# Patient Record
Sex: Female | Born: 1975 | Hispanic: No | Marital: Married | Smoking: Never smoker
Health system: Southern US, Community
[De-identification: ages and names within clinical notes are randomized; demographics above are authoritative.]

## PROBLEM LIST (undated history)

## (undated) DIAGNOSIS — D649 Anemia, unspecified: Secondary | ICD-10-CM

## (undated) DIAGNOSIS — D693 Immune thrombocytopenic purpura: Secondary | ICD-10-CM

## (undated) DIAGNOSIS — O09529 Supervision of elderly multigravida, unspecified trimester: Secondary | ICD-10-CM

## (undated) HISTORY — PX: NO PAST SURGERIES: SHX2092

---

## 2006-08-15 DIAGNOSIS — D693 Immune thrombocytopenic purpura: Secondary | ICD-10-CM

## 2006-08-15 HISTORY — DX: Immune thrombocytopenic purpura: D69.3

## 2015-08-16 NOTE — L&D Delivery Note (Signed)
Delivery Note At 6:42 PM a viable female was delivered via Vaginal, Spontaneous Delivery (Presentation: ROA; Occiput Anterior).  APGAR: 8, 9; weight 7 lb 7.6 oz (3390 g).   Placenta status: Intact, Spontaneous.  Cord: 3 vessels with the following complications: none  Anesthesia: None  Lacerations: Periurethral Est. Blood Loss (mL): 100  Mom to postpartum.  Baby to Couplet care / Skin to Skin.  Brittany Potter 11/27/2015, 7:08 PM

## 2015-08-19 LAB — OB RESULTS CONSOLE VARICELLA ZOSTER ANTIBODY, IGG: Varicella: IMMUNE

## 2015-08-19 LAB — OB RESULTS CONSOLE RUBELLA ANTIBODY, IGM: Rubella: IMMUNE

## 2015-08-19 LAB — OB RESULTS CONSOLE HEPATITIS B SURFACE ANTIGEN: Hepatitis B Surface Ag: NEGATIVE

## 2015-08-19 LAB — OB RESULTS CONSOLE HIV ANTIBODY (ROUTINE TESTING): HIV: NONREACTIVE

## 2015-08-27 ENCOUNTER — Ambulatory Visit: Payer: Self-pay

## 2015-09-03 ENCOUNTER — Ambulatory Visit (HOSPITAL_COMMUNITY)
Admission: RE | Admit: 2015-09-03 | Discharge: 2015-09-03 | Disposition: A | Discharge status: Home / Self Care | Payer: Medicaid Other | Source: Ambulatory Visit

## 2015-09-03 ENCOUNTER — Ambulatory Visit
Admission: RE | Admit: 2015-09-03 | Discharge: 2015-09-03 | Disposition: A | Discharge status: Home / Self Care | Payer: Medicaid Other | Source: Ambulatory Visit | Attending: Maternal & Fetal Medicine | Admitting: Maternal & Fetal Medicine

## 2015-09-03 ENCOUNTER — Other Ambulatory Visit
Admission: RE | Admit: 2015-09-03 | Discharge: 2015-09-03 | Disposition: A | Discharge status: Home / Self Care | Payer: Medicaid Other | Source: Ambulatory Visit | Attending: Maternal & Fetal Medicine | Admitting: Maternal & Fetal Medicine

## 2015-09-03 VITALS — BP 114/59 | HR 82 | Temp 98.3°F | Resp 18 | Ht 66.0 in | Wt 184.2 lb

## 2015-09-03 DIAGNOSIS — D696 Thrombocytopenia, unspecified: Secondary | ICD-10-CM

## 2015-09-03 DIAGNOSIS — O99119 Other diseases of the blood and blood-forming organs and certain disorders involving the immune mechanism complicating pregnancy, unspecified trimester: Secondary | ICD-10-CM | POA: Diagnosis not present

## 2015-09-03 DIAGNOSIS — Z0379 Encounter for other suspected maternal and fetal conditions ruled out: Secondary | ICD-10-CM | POA: Insufficient documentation

## 2015-09-03 DIAGNOSIS — O09529 Supervision of elderly multigravida, unspecified trimester: Secondary | ICD-10-CM | POA: Insufficient documentation

## 2015-09-03 DIAGNOSIS — O09522 Supervision of elderly multigravida, second trimester: Secondary | ICD-10-CM

## 2015-09-03 DIAGNOSIS — Z3A26 26 weeks gestation of pregnancy: Secondary | ICD-10-CM | POA: Insufficient documentation

## 2015-09-03 LAB — COMPREHENSIVE METABOLIC PANEL
ALBUMIN: 3.2 g/dL — AB (ref 3.5–5.0)
ALT: 20 U/L (ref 14–54)
ANION GAP: 6 (ref 5–15)
AST: 20 U/L (ref 15–41)
Alkaline Phosphatase: 59 U/L (ref 38–126)
BUN: 9 mg/dL (ref 6–20)
CO2: 25 mmol/L (ref 22–32)
Calcium: 8.4 mg/dL — ABNORMAL LOW (ref 8.9–10.3)
Chloride: 106 mmol/L (ref 101–111)
Creatinine, Ser: 0.42 mg/dL — ABNORMAL LOW (ref 0.44–1.00)
GFR calc Af Amer: >60 mL/min (ref >60)
GFR calc non Af Amer: >60 mL/min (ref >60)
GLUCOSE: 80 mg/dL (ref 65–99)
POTASSIUM: 3.5 mmol/L (ref 3.5–5.1)
SODIUM: 137 mmol/L (ref 135–145)
TOTAL PROTEIN: 6.5 g/dL (ref 6.5–8.1)
Total Bilirubin: 0.5 mg/dL (ref 0.3–1.2)

## 2015-09-03 LAB — CBC WITH DIFFERENTIAL/PLATELET
Basophils Absolute: 0 10*3/uL (ref 0–0.1)
EOS ABS: 0 10*3/uL (ref 0–0.7)
Eosinophils Relative: 1 %
HCT: 34.2 % — ABNORMAL LOW (ref 35.0–47.0)
HEMOGLOBIN: 11.7 g/dL — AB (ref 12.0–16.0)
LYMPHS ABS: 1.3 10*3/uL (ref 1.0–3.6)
Lymphocytes Relative: 21 %
MCH: 31.7 pg (ref 26.0–34.0)
MCHC: 34.3 g/dL (ref 32.0–36.0)
MCV: 92.4 fL (ref 80.0–100.0)
Monocytes Absolute: 0.3 10*3/uL (ref 0.2–0.9)
Monocytes Relative: 5 %
Neutro Abs: 4.6 10*3/uL (ref 1.4–6.5)
Platelets: 51 10*3/uL — ABNORMAL LOW (ref 150–440)
RBC: 3.7 MIL/uL — AB (ref 3.80–5.20)
RDW: 13.8 % (ref 11.5–14.5)
WBC: 6.3 10*3/uL (ref 3.6–11.0)

## 2015-09-03 LAB — TSH: TSH: 1.573 u[IU]/mL (ref 0.350–4.500)

## 2015-09-03 LAB — FIBRINOGEN: FIBRINOGEN: 332 mg/dL (ref 210–470)

## 2015-09-03 LAB — APTT: aPTT: 28 seconds (ref 24–36)

## 2015-09-03 LAB — RETICULOCYTES
RBC.: 3.7 MIL/uL — AB (ref 3.80–5.20)
RETIC CT PCT: 2.2 % (ref 0.4–3.1)
Retic Count, Absolute: 81.4 10*3/uL (ref 19.0–183.0)

## 2015-09-03 LAB — PROTIME-INR
INR: 1.11
PROTHROMBIN TIME: 14.5 s (ref 11.4–15.0)

## 2015-09-03 NOTE — Addendum Note (Signed)
Encounter addended by: Lady Deutscher, MD on: 09/03/2015  4:11 PM<BR>     Documentation filed: Notes Section

## 2015-09-03 NOTE — Progress Notes (Addendum)
[facility] Consultation   Chief Complaint: History of thrombocytopenia  HPI: Brittany Potter is a 40 y.o. G6P5000 at [redacted]w[redacted]d by [redacted]w[redacted]d US performed at [facility] who presents in consultation from  [facility] for a history of thrombocytopenia.  She reports thrombocytopenia whenever tested.  She was referred to a hematologist last pregnancy, but did not have insurance and did not follow-up after her initial visit.  Her prenatal care and delivery were at [facility] in [place], [place].  She denies bruising or any significant bleeding history.  Specifically, she denies nose bleeds, heavy menstrual bleeding or prolonged bleeding from trivial cuts.    Obstetric History:  1999-2011 - Full term SVD x 5 with BWs ranging from 6-8 lbs.  No hemorrhages.  Gynecologic History:   Benign. No heavy menstrual bleeding.  Past Medical History: Denies hospitalizations other than for childbirth.  Denies any other medical problems except thrombocytopenia.  Past Surgical History: None  Medications: Prenatal vitamins  Allergies: Patient has No Known Allergies.   Social History: Patient denies tobacco alcohol, tobacco drugs.  She does not work.  She lives with her children and her husband who works at a factory.    Family History: She denies any medical problems in her parents, siblings, children, nieces or nephews.  Specifically, there is no family history of a blood disorder.   Review of Systems A full 12 point review of systems was negative or as noted in the History of Present Illness.  Physical Exam: BP 114/59 mmHg  Pulse 82  Temp(Src) 98.3 F (36.8 C) (Oral)  Resp 18  Ht  (1.676 m)  Wt 184 lb 3.2 oz (83.553 kg)  BMI 29.75 kg/m2  SpO2 99% Abdomen - FH 26 cm, FHR 140 No liver or spleen enlargement Skin - no bruising or ecchymoses  CBC from 08/25/2015: White Blood Cell Count - [facility] 3.4 - 10.8 x10E3/uL 6.1  Red Blood Cell Count - [facility] 3.77 - 5.28 x10E6/uL  3.48 (L)  Hemoglobin - [facility] 11.1 - 15.9 g/dL 16.1 (L)  Hematocrit - [facility] 34.0 - 46.6 % 32.3 (L)  MCV - [facility] 79 - 97 fL 93  MCH - [facility] 26.6 - 33.0 pg 31.6  MCHC - [facility] 31.5 - 35.7 g/dL 09.6  RDW - [facility] 04.5 - 15.4 % 14.5  Platelet Count - [facility] 150 - 379 x10E3/uL 43 (<)  Neutrophils - [facility] % 72  Lymphs - [facility] % 21  Monocytes - [facility] % 6  Eos - [facility] % 1  Basos - [facility] % 0  Neutrophils (Absolute) - [facility] 1.4 - 7.0 x10E3/uL 4.4  Lymphs (Absolute) - [facility] 0.7 - 3.1 x10E3/uL 1.3  Monocytes(Absolute) - [facility] 0.1 - 0.9 x10E3/uL 0.4  Eos (Absolute) - [facility] 0.0 - 0.4 x10E3/uL 0.1  Baso (Absolute) - [facility] 0.0 - 0.2 x10E3/uL 0.0  Immature Granulocytes - [facility] % 0  Immature Grans (Abs) - [facility] 0.0 - 0.1 x10E3/uL 0.0  Hematology Comments: - [facility]  Note:  HIV - negative Hep B negative  Labs from today: Lab Results  Component Value Date   WBC 6.3 09/03/2015   HGB 11.7* 09/03/2015   HCT 34.2* 09/03/2015   MCV 92.4 09/03/2015   PLT 51* 09/03/2015   Lab Results  Component Value Date   TSH 1.573 09/03/2015     Lab Results  Component Value Date   ALT 20 09/03/2015   AST 20 09/03/2015   ALKPHOS 59 09/03/2015   BILITOT 0.5 09/03/2015  Lab Results  Component Value Date   CREATININE 0.42* 09/03/2015   PTT - 28 PT - pending Fibrinogen - 332  Asessement: 40 yo gravida 6 para 5005 at [redacted]w[redacted]d gestation with thrombocytopenia - 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  The patient signed a release so that we could obtain her records from [facility] in [place] and review them. 2.  Labs were sent today to exclude secondary causes of thrombocytopenia:  CBC (plts 51K, otherwise normal), retic count (2.2%), peripheral smear (pending), CMP (normal), and hep C (pending).  We  will also obtain a LAC, ACA, AB2GP1, TSH (normal), PT (pending), PTT (normal) and fibrinogen (normal). 3.  Because of her age > 60, she was introduced to our Dentist today.  The patient declined any genetic screening or testing. 4.  The patient was counseled that as long as she is asymptomatic and her platelet count is > 30K, she will not require any treatment for the majority of her pregnancy.  In the last 4-6 weeks of gestation, however, if her platelet count is < 100,000 we will likely recommend treatment.  She should have a platelet count of at least 50K for delivery and, since a regional anesthetic is preferred to general in case of emergency, even if she does not think she would want an epidural, her platelet count should be at least 70-100K in anticipation of delivery.  At 36-[redacted] weeks gestation, if her diagnosis is consistent with ITP, she would be a candidate for treatment starting with prednisone at a dose of 10-20 mg per day up to 1 mg/kg per day.  We would recommend delivery when her platelet count was in target range and she was at least [redacted] weeks gestation.   5. At the time of delivery, if her platelets are not in target range, she should likely be transferred to [facility] or [facility].  Platelets are not always available, were they to be needed, at [facility]. 6. The patient has an appointment with Dr. Orlie Dakin next week and was told she should keep this.   7. I will see her at 34-[redacted] weeks gestation for delivery planning.  I can be reached in the interim through the [facility] operator.     Total time spent with the patient was 40 minutes with greater than 50% spent in counseling and coordination of care.  We appreciate thisconsult and will be happy to be involved in the ongoing care of [name].  Argentina Ponder, MD [facility]  Addendum: APS labs normal PT normal Hep C normal

## 2015-09-03 NOTE — Progress Notes (Addendum)
Referring Provider:   [facility] Length of Consultation: 40 minute consultation  Ms. Berish was referred to [facility] for genetic counseling because of advanced maternal age.  The patient will be 40 years old at the time of delivery.  This note summarizes the information we discussed.    We explained that the chance of a chromosome abnormality increases with maternal age.  Chromosomes and examples of chromosome problems were reviewed.  Humans typically have 46 chromosomes in each cell, with half passed through each sperm and egg.  Any change in the number or structure of chromosomes can increase the risk of problems in the physical and mental development of a pregnancy.   Based upon age of the patient, the chance of any chromosome abnormality was 1 in 34. The chance of Down syndrome, the most common chromosome problem associated with maternal age, was 1 in 7.  The risk of chromosome problems is in addition to the 3% general population risk for birth defects and mental retardation.  The greatest chance, of course, is that the baby would be born in good health.  We discussed the following prenatal screening and testing options for this pregnancy given the late gestational age:  Targeted ultrasound uses high frequency sound waves to create an image of the developing fetus.  An ultrasound is often recommended as a routine means of evaluating the pregnancy.  It is also used to screen for fetal anatomy problems (for example, a heart defect) that might be suggestive of a chromosomal or other abnormality.   Amniocentesis involves the removal of a small amount of amniotic fluid from the sac surrounding the fetus with the use of a thin needle inserted through the maternal abdomen and uterus.  Ultrasound guidance is used throughout the procedure.  Fetal cells from amniotic fluid are directly evaluated and > 99.5% of chromosome problems and > 98% of open neural tube defects can be  detected. This procedure is generally performed after the 15th week of pregnancy.  The main risks to this procedure include complications leading to miscarriage in less than 1 in 200 cases (0.5%).  We also offered cell free fetal DNA testing from maternal blood to determine whether or not the baby may have either Down syndrome, trisomy 77, or trisomy 47.  This test utilizes a maternal blood sample and DNA sequencing technology to isolate circulating cell free fetal DNA from maternal plasma.  The fetal DNA can then be analyzed for DNA sequences that are derived from the three most common chromosomes involved in aneuploidy, chromosomes 13, 18, and 21.  If the overall amount of DNA is greater than the expected level for any of these chromosomes, aneuploidy is suspected.  While we do not consider it a replacement for invasive testing and karyotype analysis, a negative result from this testing would be reassuring, though not a guarantee of a normal chromosome complement for the baby.  An abnormal result is certainly suggestive of an abnormal chromosome complement, though we would still recommend amniocentesis to confirm any findings from this testing.  We obtained a detailed family history and pregnancy history.  The family history is unremarkable for birth defects, mental retardation, recurrent pregnancy loss or known chromosome abnormalities.  Ms. Fofana stated that this is her sixth pregnancy.  She and her husband have five healthy children.    Ms. Bomba has a documented history of low platelets and was referred today to also see Dr. Fayrene Fearing for an MFM consultation.  Please  see her notes for recommendations regarding that history.  The patient reported no complications or exposures in this pregnancy that would be expected to increase the risk for birth defects.  After consideration of the options, Ms. Gange elected to decline cell free fetal DNA testing.  Ms. Galka was encouraged to call with  questions or concerns.  We can be contacted at [phone].  Cherly Anderson, MS, CGC  I was immediately available and supervising. Argentina Ponder, MD [facility]

## 2015-09-03 NOTE — Addendum Note (Signed)
Encounter addended by: Lady Deutscher, MD on: 09/03/2015  4:07 PM<BR>     Documentation filed: Notes Section

## 2015-09-04 ENCOUNTER — Ambulatory Visit: Payer: Self-pay | Admitting: Oncology

## 2015-09-04 LAB — PATHOLOGIST SMEAR REVIEW

## 2015-09-07 ENCOUNTER — Inpatient Hospital Stay: Payer: Medicaid Other | Attending: Oncology | Admitting: Oncology

## 2015-09-07 VITALS — BP 108/71 | HR 84 | Temp 98.2°F | Resp 18 | Wt 183.6 lb

## 2015-09-07 DIAGNOSIS — D6959 Other secondary thrombocytopenia: Secondary | ICD-10-CM | POA: Diagnosis not present

## 2015-09-07 DIAGNOSIS — D693 Immune thrombocytopenic purpura: Secondary | ICD-10-CM

## 2015-09-07 DIAGNOSIS — O99113 Other diseases of the blood and blood-forming organs and certain disorders involving the immune mechanism complicating pregnancy, third trimester: Secondary | ICD-10-CM | POA: Diagnosis present

## 2015-09-07 DIAGNOSIS — Z79899 Other long term (current) drug therapy: Secondary | ICD-10-CM

## 2015-09-07 NOTE — Progress Notes (Signed)
Patient here today as new evaluation regarding thrombocytopenia in pregnancy.  Referred by Dr. Idelle Jo, GYN.

## 2015-09-08 LAB — LUPUS ANTICOAGULANT PANEL
DRVVT: 32.5 s (ref 0.0–44.0)
PTT Lupus Anticoagulant: 33.3 s (ref 0.0–40.6)

## 2015-09-08 LAB — BETA-2-GLYCOPROTEIN I ABS, IGG/M/A

## 2015-09-08 LAB — CARDIOLIPIN ANTIBODIES, IGG, IGM, IGA
Anticardiolipin IgA: <9 APL U/mL (ref 0–11)
Anticardiolipin IgG: <9 GPL U/mL (ref 0–14)
Anticardiolipin IgM: <9 MPL U/mL (ref 0–12)

## 2015-09-08 LAB — HEPATITIS C ANTIBODY

## 2015-09-11 NOTE — Progress Notes (Signed)
[facility]  [phone] [phone]  ID: Brittany Potter OB: 1975/09/14  MR#: [id number]  AOZ#:[id number]  Patient Care Team: Sharee Pimple, CNM as PCP - General (Obstetrics and Gynecology)  CHIEF COMPLAINT:  Chief Complaint  Patient presents with  . Thromboyctopenia in pregnancy    INTERVAL HISTORY: Brittany Potter is a 40 year old female who was found to have a significantly decreased platelet count on routine blood work. This is her sixth pregnancy. Patient states she has been told she had thrombocytopenia with previously pregnancies and also reports her platelet count did not improve postpartumly. She currently feels well and is asymptomatic. She denies any easy bleeding or bruising. She has no neurologic complaints. She is gaining weight appropriately. She denies any chest pain or shortness of breath. She denies any nausea, vomiting, constipation, or diarrhea. She has no urinary complaints. Patient feels at her baseline and offers no specific complaints today.  REVIEW OF SYSTEMS:   Review of Systems  Constitutional: Negative for fever and malaise/fatigue.  Respiratory: Negative.   Cardiovascular: Negative.   Gastrointestinal: Negative.  Negative for blood in stool and melena.  Genitourinary: Negative.   Musculoskeletal: Negative.   Neurological: Negative.  Negative for weakness.  Endo/Heme/Allergies: Does not bruise/bleed easily.    As per HPI. Otherwise, a complete review of systems is negatve.  PAST MEDICAL HISTORY: No past medical history on file.  PAST SURGICAL HISTORY: No past surgical history on file.  FAMILY HISTORY: Reviewed and unchanged. No reported history of malignancy or chronic disease.     ADVANCED DIRECTIVES:    HEALTH MAINTENANCE: Social History  Substance Use Topics  . Smoking status: Never Smoker   . Smokeless tobacco: Not on file  . Alcohol Use: No     Colonoscopy:  PAP:  Bone density:  Lipid panel:  No  Known Allergies  Current Outpatient Prescriptions  Medication Sig Dispense Refill  . Prenatal Vit-Fe Fumarate-FA (PRENATAL MULTIVITAMIN) TABS tablet Take 1 tablet by mouth daily at 12 noon.     No current facility-administered medications for this visit.    OBJECTIVE: Filed Vitals:   09/07/15 1412  BP: 108/71  Pulse: 84  Temp: 98.2 F (36.8 C)  Resp: 18     Body mass index is 29.65 kg/(m^2).    ECOG FS:0 - Asymptomatic  General: Well-developed, well-nourished, no acute distress. Eyes: Pink conjunctiva, anicteric sclera. HEENT: Normocephalic, moist mucous membranes, clear oropharnyx. Lungs: Clear to auscultation bilaterally. Heart: Regular rate and rhythm. No rubs, murmurs, or gallops. Abdomen: Appears appropriate for gestational age. Musculoskeletal: No edema, cyanosis, or clubbing. Neuro: Alert, answering all questions appropriately. Cranial nerves grossly intact. Skin: No rashes or petechiae noted. Psych: Normal affect. Lymphatics: No cervical, calvicular, axillary or inguinal LAD.   LAB RESULTS:  Lab Results  Component Value Date   NA 137 09/03/2015   K 3.5 09/03/2015   CL 106 09/03/2015   CO2 25 09/03/2015   GLUCOSE 80 09/03/2015   BUN 9 09/03/2015   CREATININE 0.42* 09/03/2015   CALCIUM 8.4* 09/03/2015   PROT 6.5 09/03/2015   ALBUMIN 3.2* 09/03/2015   AST 20 09/03/2015   ALT 20 09/03/2015   ALKPHOS 59 09/03/2015   BILITOT 0.5 09/03/2015   GFRNONAA >60 09/03/2015   GFRAA >60 09/03/2015    Lab Results  Component Value Date   WBC 6.3 09/03/2015   NEUTROABS 4.6 09/03/2015   HGB 11.7* 09/03/2015   HCT 34.2* 09/03/2015   MCV 92.4 09/03/2015   PLT 51* 09/03/2015  STUDIES: No results found.  ASSESSMENT: Thrombocytopenia in pregnancy.  PLAN:    1. Thrombocytopenia in pregnancy: Consistent with ITP. Patient reports persistent thrombocytopenia and her previous pregnancies as well as periods of time when she was not pregnant, although we do not have  documentation her laboratory work from her medical records in [place]. The remainder of her laboratory work is either negative or within normal limits. No intervention is needed at this time. If patient's platelet count continues to remain low approaching her due date would consider treatment with prednisone at 1 mg/kg per day. Return to clinic in the first week of March approximately one month prior to her due date for repeat laboratory work and further evaluation.  Patient expressed understanding and was in agreement with this plan. She also understands that She can call clinic at any time with any questions, concerns, or complaints.    Jeralyn Ruths, MD   09/11/2015 10:26 AM

## 2015-09-17 NOTE — Addendum Note (Signed)
Encounter addended by: Lady Deutscher, MD on: 09/17/2015  8:39 AM<BR>     Documentation filed: Notes Section

## 2015-09-24 ENCOUNTER — Ambulatory Visit
Admission: RE | Admit: 2015-09-24 | Discharge: 2015-09-24 | Disposition: A | Discharge status: Home / Self Care | Payer: Medicaid Other | Source: Ambulatory Visit | Attending: Obstetrics and Gynecology | Admitting: Obstetrics and Gynecology

## 2015-09-24 DIAGNOSIS — O09522 Supervision of elderly multigravida, second trimester: Secondary | ICD-10-CM

## 2015-10-15 ENCOUNTER — Inpatient Hospital Stay: Payer: Medicaid Other

## 2015-10-15 ENCOUNTER — Inpatient Hospital Stay: Payer: Medicaid Other | Admitting: Oncology

## 2015-10-20 ENCOUNTER — Inpatient Hospital Stay: Payer: Medicaid Other | Attending: Oncology

## 2015-10-20 ENCOUNTER — Inpatient Hospital Stay (HOSPITAL_BASED_OUTPATIENT_CLINIC_OR_DEPARTMENT_OTHER): Payer: Medicaid Other | Admitting: Oncology

## 2015-10-20 VITALS — BP 125/85 | HR 86 | Temp 97.2°F | Resp 16 | Wt 181.9 lb

## 2015-10-20 DIAGNOSIS — O99113 Other diseases of the blood and blood-forming organs and certain disorders involving the immune mechanism complicating pregnancy, third trimester: Secondary | ICD-10-CM

## 2015-10-20 DIAGNOSIS — O99119 Other diseases of the blood and blood-forming organs and certain disorders involving the immune mechanism complicating pregnancy, unspecified trimester: Secondary | ICD-10-CM | POA: Diagnosis not present

## 2015-10-20 DIAGNOSIS — D693 Immune thrombocytopenic purpura: Secondary | ICD-10-CM

## 2015-10-20 DIAGNOSIS — D509 Iron deficiency anemia, unspecified: Secondary | ICD-10-CM | POA: Insufficient documentation

## 2015-10-20 DIAGNOSIS — Z79899 Other long term (current) drug therapy: Secondary | ICD-10-CM

## 2015-10-20 LAB — CBC WITH DIFFERENTIAL/PLATELET
BASOS PCT: 0 %
Basophils Absolute: 0 10*3/uL (ref 0–0.1)
EOS ABS: 0 10*3/uL (ref 0–0.7)
Eosinophils Relative: 0 %
HCT: 30.6 % — ABNORMAL LOW (ref 35.0–47.0)
HEMOGLOBIN: 10.7 g/dL — AB (ref 12.0–16.0)
Lymphocytes Relative: 23 %
Lymphs Abs: 1.4 10*3/uL (ref 1.0–3.6)
MCH: 32.3 pg (ref 26.0–34.0)
MCHC: 35 g/dL (ref 32.0–36.0)
MCV: 92.3 fL (ref 80.0–100.0)
Monocytes Absolute: 0.3 10*3/uL (ref 0.2–0.9)
Monocytes Relative: 6 %
NEUTROS PCT: 71 %
Neutro Abs: 4.3 10*3/uL (ref 1.4–6.5)
Platelets: 51 10*3/uL — ABNORMAL LOW (ref 150–440)
RBC: 3.32 MIL/uL — AB (ref 3.80–5.20)
RDW: 13 % (ref 11.5–14.5)
WBC: 6.1 10*3/uL (ref 3.6–11.0)

## 2015-10-20 LAB — IRON AND TIBC
IRON: 73 ug/dL (ref 28–170)
SATURATION RATIOS: 16 % (ref 10.4–31.8)
TIBC: 465 ug/dL — ABNORMAL HIGH (ref 250–450)
UIBC: 392 ug/dL

## 2015-10-20 LAB — FERRITIN: FERRITIN: 7 ng/mL — AB (ref 11–307)

## 2015-10-20 MED ORDER — PREDNISONE 20 MG PO TABS: 80.0000 mg | ORAL_TABLET | Freq: Every day | ORAL | Status: DC

## 2015-10-20 NOTE — Progress Notes (Signed)
Patient does not offer any concerns today.  Her due date is 12/11/2015

## 2015-10-26 DIAGNOSIS — D509 Iron deficiency anemia, unspecified: Secondary | ICD-10-CM | POA: Insufficient documentation

## 2015-10-26 NOTE — Progress Notes (Signed)
[facility]  [phone] [phone]  ID: Brittany HeadingsHanan Potter OB: [birth year]  MR#: [id number]  AOZ#:[id number]#:[id number]  Patient Care Team: Sharee Pimplearon W Jones, CNM as PCP - General (Obstetrics and Gynecology)  CHIEF COMPLAINT:  Chief Complaint  Patient presents with  . Follow-up    Immune sthrombocytopenia affecting pregnancy    INTERVAL HISTORY: Patient returns to clinic today for repeat laboratory work and further evaluation. Her pregnancy has been uneventful that she is gaining weight appropriately. She currently feels well and is asymptomatic. She denies any easy bleeding or bruising. She has no neurologic complaints. She denies any chest pain or shortness of breath. She denies any nausea, vomiting, constipation, or diarrhea. She has no urinary complaints. Patient offers no specific complaints today.  REVIEW OF SYSTEMS:   Review of Systems  Constitutional: Negative for fever and malaise/fatigue.  Respiratory: Negative.   Cardiovascular: Negative.   Gastrointestinal: Negative.  Negative for blood in stool and melena.  Genitourinary: Negative.   Musculoskeletal: Negative.   Neurological: Negative.  Negative for weakness.  Endo/Heme/Allergies: Does not bruise/bleed easily.    As per HPI. Otherwise, a complete review of systems is negatve.  PAST MEDICAL HISTORY: No past medical history on file.  PAST SURGICAL HISTORY: No past surgical history on file.  FAMILY HISTORY: Reviewed and unchanged. No reported history of malignancy or chronic disease.     ADVANCED DIRECTIVES:    HEALTH MAINTENANCE: Social History  Substance Use Topics  . Smoking status: Never Smoker   . Smokeless tobacco: Not on file  . Alcohol Use: No     Colonoscopy:  PAP:  Bone density:  Lipid panel:  No Known Allergies  Current Outpatient Prescriptions  Medication Sig Dispense Refill  . Prenatal Vit-Fe Fumarate-FA (PRENATAL MULTIVITAMIN) TABS tablet Take 1 tablet by mouth  daily at 12 noon.    . predniSONE (DELTASONE) 20 MG tablet Take 4 tablets (80 mg total) by mouth daily with breakfast. Take for 7 days. 28 tablet 0   No current facility-administered medications for this visit.    OBJECTIVE: Filed Vitals:   10/20/15 1054  BP: 125/85  Pulse: 86  Temp: 97.2 F (36.2 C)  Resp: 16     Body mass index is 29.37 kg/(m^2).    ECOG FS:0 - Asymptomatic  General: Well-developed, well-nourished, no acute distress. Eyes: Pink conjunctiva, anicteric sclera. Lungs: Clear to auscultation bilaterally. Heart: Regular rate and rhythm. No rubs, murmurs, or gallops. Abdomen: Appears appropriate for gestational age. Musculoskeletal: No edema, cyanosis, or clubbing. Neuro: Alert, answering all questions appropriately. Cranial nerves grossly intact. Skin: No rashes or petechiae noted. Psych: Normal affect.   LAB RESULTS:  Lab Results  Component Value Date   NA 137 09/03/2015   K 3.5 09/03/2015   CL 106 09/03/2015   CO2 25 09/03/2015   GLUCOSE 80 09/03/2015   BUN 9 09/03/2015   CREATININE 0.42* 09/03/2015   CALCIUM 8.4* 09/03/2015   PROT 6.5 09/03/2015   ALBUMIN 3.2* 09/03/2015   AST 20 09/03/2015   ALT 20 09/03/2015   ALKPHOS 59 09/03/2015   BILITOT 0.5 09/03/2015   GFRNONAA >60 09/03/2015   GFRAA >60 09/03/2015    Lab Results  Component Value Date   WBC 6.1 10/20/2015   NEUTROABS 4.3 10/20/2015   HGB 10.7* 10/20/2015   HCT 30.6* 10/20/2015   MCV 92.3 10/20/2015   PLT 51* 10/20/2015     STUDIES: No results found.  ASSESSMENT: Thrombocytopenia in pregnancy.  PLAN:    1.  Thrombocytopenia in pregnancy: Consistent with ITP. Patient reports persistent thrombocytopenia and her previous pregnancies as well as periods of time when she was not pregnant, although we do not have documentation her laboratory work from her medical records in [place]. Patient has some mild iron deficiency, but otherwise the remainder of her laboratory work was either  negative or within normal limits. Will give patient a first dose of prednisone 1 mg/kg or 80 mg daily 7 days. Return to clinic in 1 week for laboratory work and then in 2 weeks for laboratory work and further evaluation. 2. Iron deficiency anemia: We will schedule Feraheme for next clinic visit. 3. Pregnancy: Patient is due date is mid April.  Patient expressed understanding and was in agreement with this plan. She also understands that She can call clinic at any time with any questions, concerns, or complaints.    Jeralyn Ruths, MD   10/26/2015 5:33 AM

## 2015-10-27 ENCOUNTER — Inpatient Hospital Stay: Payer: Medicaid Other

## 2015-10-27 DIAGNOSIS — D693 Immune thrombocytopenic purpura: Secondary | ICD-10-CM

## 2015-10-27 DIAGNOSIS — O99119 Other diseases of the blood and blood-forming organs and certain disorders involving the immune mechanism complicating pregnancy, unspecified trimester: Secondary | ICD-10-CM | POA: Diagnosis not present

## 2015-10-27 LAB — CBC WITH DIFFERENTIAL/PLATELET
BASOS ABS: 0 10*3/uL (ref 0–0.1)
BASOS PCT: 0 %
EOS PCT: 0 %
Eosinophils Absolute: 0 10*3/uL (ref 0–0.7)
HEMATOCRIT: 29 % — AB (ref 35.0–47.0)
Hemoglobin: 10.4 g/dL — ABNORMAL LOW (ref 12.0–16.0)
Lymphocytes Relative: 26 %
Lymphs Abs: 2.5 10*3/uL (ref 1.0–3.6)
MCH: 32.2 pg (ref 26.0–34.0)
MCHC: 35.7 g/dL (ref 32.0–36.0)
MCV: 90.2 fL (ref 80.0–100.0)
MONO ABS: 1 10*3/uL — AB (ref 0.2–0.9)
Monocytes Relative: 10 %
NEUTROS ABS: 6.3 10*3/uL (ref 1.4–6.5)
Neutrophils Relative %: 64 %
PLATELETS: 125 10*3/uL — AB (ref 150–440)
RBC: 3.22 MIL/uL — ABNORMAL LOW (ref 3.80–5.20)
RDW: 12.6 % (ref 11.5–14.5)
WBC: 9.8 10*3/uL (ref 3.6–11.0)

## 2015-11-02 ENCOUNTER — Inpatient Hospital Stay: Payer: Medicaid Other

## 2015-11-02 ENCOUNTER — Inpatient Hospital Stay (HOSPITAL_BASED_OUTPATIENT_CLINIC_OR_DEPARTMENT_OTHER): Payer: Medicaid Other | Admitting: Oncology

## 2015-11-02 VITALS — BP 105/62 | HR 70 | Temp 97.8°F | Resp 18 | Wt 182.5 lb

## 2015-11-02 VITALS — BP 101/65 | HR 76 | Temp 97.2°F

## 2015-11-02 DIAGNOSIS — D509 Iron deficiency anemia, unspecified: Secondary | ICD-10-CM | POA: Diagnosis not present

## 2015-11-02 DIAGNOSIS — D693 Immune thrombocytopenic purpura: Secondary | ICD-10-CM

## 2015-11-02 DIAGNOSIS — Z79899 Other long term (current) drug therapy: Secondary | ICD-10-CM

## 2015-11-02 DIAGNOSIS — O99119 Other diseases of the blood and blood-forming organs and certain disorders involving the immune mechanism complicating pregnancy, unspecified trimester: Secondary | ICD-10-CM

## 2015-11-02 LAB — CBC WITH DIFFERENTIAL/PLATELET
BASOS ABS: 0 10*3/uL (ref 0–0.1)
BASOS PCT: 0 %
Eosinophils Absolute: 0 10*3/uL (ref 0–0.7)
Eosinophils Relative: 1 %
HEMATOCRIT: 31.2 % — AB (ref 35.0–47.0)
HEMOGLOBIN: 10.9 g/dL — AB (ref 12.0–16.0)
LYMPHS PCT: 21 %
Lymphs Abs: 1.6 10*3/uL (ref 1.0–3.6)
MCH: 31.5 pg (ref 26.0–34.0)
MCHC: 34.8 g/dL (ref 32.0–36.0)
MCV: 90.7 fL (ref 80.0–100.0)
Monocytes Absolute: 0.5 10*3/uL (ref 0.2–0.9)
Monocytes Relative: 6 %
NEUTROS ABS: 5.4 10*3/uL (ref 1.4–6.5)
NEUTROS PCT: 72 %
Platelets: 67 10*3/uL — ABNORMAL LOW (ref 150–440)
RBC: 3.44 MIL/uL — AB (ref 3.80–5.20)
RDW: 12.4 % (ref 11.5–14.5)
WBC: 7.5 10*3/uL (ref 3.6–11.0)

## 2015-11-02 MED ORDER — PREDNISONE 20 MG PO TABS: 80.0000 mg | ORAL_TABLET | Freq: Every day | ORAL | Status: DC

## 2015-11-02 MED ORDER — SODIUM CHLORIDE 0.9 % IV SOLN
Freq: Once | INTRAVENOUS | Status: AC
  Administered 2015-11-02: 15:00:00 via INTRAVENOUS
  Filled 2015-11-02: qty 1000

## 2015-11-02 MED ORDER — FERUMOXYTOL INJECTION 510 MG/17 ML
510.0000 mg | Freq: Once | INTRAVENOUS | Status: AC
  Administered 2015-11-02: 510 mg via INTRAVENOUS
  Filled 2015-11-02: qty 17

## 2015-11-03 ENCOUNTER — Other Ambulatory Visit: Payer: Medicaid Other

## 2015-11-03 ENCOUNTER — Ambulatory Visit: Payer: Medicaid Other | Admitting: Oncology

## 2015-11-05 ENCOUNTER — Ambulatory Visit
Admission: RE | Admit: 2015-11-05 | Discharge: 2015-11-05 | Disposition: A | Discharge status: Home / Self Care | Payer: Medicaid Other | Source: Ambulatory Visit | Attending: Maternal & Fetal Medicine | Admitting: Maternal & Fetal Medicine

## 2015-11-05 VITALS — BP 119/61 | HR 88 | Temp 98.2°F | Resp 18 | Wt 186.2 lb

## 2015-11-05 DIAGNOSIS — Z3A34 34 weeks gestation of pregnancy: Secondary | ICD-10-CM

## 2015-11-05 DIAGNOSIS — O09523 Supervision of elderly multigravida, third trimester: Secondary | ICD-10-CM | POA: Diagnosis not present

## 2015-11-05 DIAGNOSIS — D693 Immune thrombocytopenic purpura: Secondary | ICD-10-CM

## 2015-11-05 DIAGNOSIS — O99119 Other diseases of the blood and blood-forming organs and certain disorders involving the immune mechanism complicating pregnancy, unspecified trimester: Secondary | ICD-10-CM | POA: Diagnosis not present

## 2015-11-05 DIAGNOSIS — D696 Thrombocytopenia, unspecified: Secondary | ICD-10-CM

## 2015-11-05 HISTORY — DX: Anemia, unspecified: D64.9

## 2015-11-05 NOTE — Addendum Note (Signed)
Encounter addended by: Lady DeutscherAndra Mathis Cashman, MD on: 11/05/2015  1:54 PM<BR>     Documentation filed: [employer], Follow-up Section, Orders

## 2015-11-05 NOTE — Progress Notes (Signed)
[facility] Follow-up Consultation   Chief Complaint: History of thrombocytopenia  HPI: [name] is a 40 y.o. G6P5005 (Full term SVD x 5 with BWs ranging from 6-8 lbs. No hemorrhages.) who is at [redacted]w[redacted]d by [redacted]w[redacted]d US performed at [facility] on 08/28/2015 who presents for follow-up consultation from  [facility] for a history of thrombocytopenia.    Since her last visit here 09/03/2015, she has seen Dr. 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  Her platelet count here on 09/03/2015 was 51,000.  After receiving a short course of prednisone 80 mg per day, the patient had a platelet count of 125,000  10/27/2015.  One week later, on 11/02/2015 when she was off therapy, her platelet count came down to 67,000.    Today, she denies any bleeding. Specifically, she denies nose bleeds, bruising  or vaginal bleeding.    Current Medications: Prenatal vitamins  Review of Systems As noted in the History of Present Illness.  Physical Exam: BP 119/61 mmHg  Pulse 88  Temp(Src) 98.2 F (36.8 C) (Oral)  Resp 18  Wt 186 lb 3.2 oz (84.46 kg)  SpO2 99% Body mass index is 30.07 kg/(m^2).   The remainder of the exam was deferred.  Asessement: 40 yo gravida 6 para 5005 at [redacted]w[redacted]d gestation with: 1.  ITP  2. AMA  Recommendations: 1.  ITP  -- The patient was previously counseled that as long as she is asymptomatic and her platelet count is > 30K, she would not require any treatment for the majority of her pregnancy. In the last 4-6 weeks of gestation, however, if her platelet count were to be < 100,000 we would likely recommend treatment.  -- She should have a platelet count of at least 50K for delivery and, since a regional anesthetic is preferred to general in case of emergency, even if she does not think she would want an epidural, her platelet count should be at least 70-100K in anticipation of delivery. (The anesthesiologists here at [facility] prefer a platelet count >  100,000 for regional anesthesia.) -- At 36-[redacted] weeks gestation, since her diagnosis is consistent with ITP, she is a candidate for treatment starting with prednisone. Dr. Orlie Dakin has already verified that the patient responds to prednisone.  The patient has a prescription for prednisone 80 mg per day to take in anticipation of delivery. I spoke with Dr. Orlie Dakin today.  I would recommend that she start her prednisone at [redacted] weeks gestation (April 7). I have scheduled her to see me April 13 for a final delivery planning visit.  In the interim, I can be reached through the [facility] operator, if necessary.  I would recommend delivery as soon as her platelet count is in target range (>100,000) after starting the prednisone.  This will help assure that she has an adequate platelet count for delivery and regional anesthesia, and will help minimize her exposure to high dose steroids. -- I have scheduled the patient to return here April 3 (our next ultrasound appt) for growth ultrasound and further verification of dates, since she is dated by a 25 week [place].   -- If at the time of delivery her platelets are not in target range, she should likely be transferred to [facility] or [facility]. Platelets are not always available, were they to be needed, at [facility]. -- In the interim, since ITP is an autoimmune disease, I would recommend weekly antepartum testing (NST + AFI or BPP). -- Intrapartum and postpartum, the patient  has two risk factors for thrombosis - ITP and AMA.  I would suggest pneumatic compression devices in active labor and until she becomes fully ambulatory. -- After delivery, the infant should have a platelet count obtained. 2. AMA - The patient declined any genetic screening or testing.  Total time spent with the patient was 30  minutes with greater than 50% spent in counseling and coordination of care.  Argentina PonderAndra H. Mosetta Ferdinand, MD [facility]

## 2015-11-05 NOTE — Addendum Note (Signed)
Encounter addended by: Lady DeutscherAndra Aradia Estey, MD on: 11/05/2015  1:59 PM<BR>     Documentation filed: Clinical Notes

## 2015-11-12 LAB — OB RESULTS CONSOLE GBS: GBS: NEGATIVE

## 2015-11-15 NOTE — Progress Notes (Signed)
[facility]  [phone]) [phone] [phone]) [phone]  ID: Brittany HeadingsHanan Potter OB: September 09, 1975  MR#: [id number]  XBM#:[id number]#:[id number]  Patient Care Team: Sharee Pimplearon W Jones, CNM as PCP - General (Obstetrics and Gynecology)  CHIEF COMPLAINT:  Chief Complaint  Patient presents with  . Iron deficiency anemia    INTERVAL HISTORY: Patient returns to clinic today for repeat laboratory work and further evaluation. Her platelets responded well to prednisone, but now have trended back down. She currently feels well and is asymptomatic. She denies any easy bleeding or bruising. She has no neurologic complaints. She denies any chest pain or shortness of breath. She denies any nausea, vomiting, constipation, or diarrhea. She has no urinary complaints. Patient offers no specific complaints today.  REVIEW OF SYSTEMS:   Review of Systems  Constitutional: Negative for fever and malaise/fatigue.  Respiratory: Negative.   Cardiovascular: Negative.   Gastrointestinal: Negative.  Negative for blood in stool and melena.  Genitourinary: Negative.   Musculoskeletal: Negative.   Neurological: Negative.  Negative for weakness.  Endo/Heme/Allergies: Does not bruise/bleed easily.    As per HPI. Otherwise, a complete review of systems is negatve.  PAST MEDICAL HISTORY: Past Medical History  Diagnosis Date  . Anemia     PAST SURGICAL HISTORY: Past Surgical History  Procedure Laterality Date  . No past surgeries      FAMILY HISTORY: Reviewed and unchanged. No reported history of malignancy or chronic disease.     ADVANCED DIRECTIVES:    HEALTH MAINTENANCE: Social History  Substance Use Topics  . Smoking status: Never Smoker   . Smokeless tobacco: Not on file  . Alcohol Use: No     Colonoscopy:  PAP:  Bone density:  Lipid panel:  No Known Allergies  Current Outpatient Prescriptions  Medication Sig Dispense Refill  . Prenatal Vit-Fe Fumarate-FA (PRENATAL MULTIVITAMIN) TABS  tablet Take 1 tablet by mouth daily at 12 noon.     No current facility-administered medications for this visit.    OBJECTIVE: Filed Vitals:   11/02/15 1357  BP: 105/62  Pulse: 70  Temp: 97.8 F (36.6 C)  Resp: 18     Body mass index is 29.48 kg/(m^2).    ECOG FS:0 - Asymptomatic  General: Well-developed, well-nourished, no acute distress. Eyes: Pink conjunctiva, anicteric sclera. Lungs: Clear to auscultation bilaterally. Heart: Regular rate and rhythm. No rubs, murmurs, or gallops. Abdomen: Appears appropriate for gestational age. Musculoskeletal: No edema, cyanosis, or clubbing. Neuro: Alert, answering all questions appropriately. Cranial nerves grossly intact. Skin: No rashes or petechiae noted. Psych: Normal affect.   LAB RESULTS:  Lab Results  Component Value Date   NA 137 09/03/2015   K 3.5 09/03/2015   CL 106 09/03/2015   CO2 25 09/03/2015   GLUCOSE 80 09/03/2015   BUN 9 09/03/2015   CREATININE 0.42* 09/03/2015   CALCIUM 8.4* 09/03/2015   PROT 6.5 09/03/2015   ALBUMIN 3.2* 09/03/2015   AST 20 09/03/2015   ALT 20 09/03/2015   ALKPHOS 59 09/03/2015   BILITOT 0.5 09/03/2015   GFRNONAA >60 09/03/2015   GFRAA >60 09/03/2015    Lab Results  Component Value Date   WBC 7.5 11/02/2015   NEUTROABS 5.4 11/02/2015   HGB 10.9* 11/02/2015   HCT 31.2* 11/02/2015   MCV 90.7 11/02/2015   PLT 67* 11/02/2015     STUDIES: No results found.  ASSESSMENT: Thrombocytopenia in pregnancy.  PLAN:    1. Thrombocytopenia in pregnancy: Consistent with ITP. Patient reports persistent thrombocytopenia and her previous  pregnancies as well as periods of time when she was not pregnant, although we do not have documentation her laboratory work from her medical records in [place]. Patient has some mild iron deficiency, but otherwise the remainder of her laboratory work was either negative or within normal limits. Patient's platelets responded well with treatment of prednisone 1  mg/kg or 80 mg daily 7 days. Increasing from 51 to 125. One week after discontinuing, her platelets are trending back down to 67. Patient was given a prescription for prednisone and instructed to initiate treatment 1 week prior to her due date. Patient will then return to clinic approximately one month after giving birth for further evaluation and repeat laboratory work. 2. Iron deficiency anemia: She received one infusion of 510 mg IV Feraheme today. 3. Pregnancy: Patient is due date is mid April. Patient is also followed closely by [facility] in case was discussed at length with Dr. Fayrene Fearing.  Approximately 30 minutes was spent in discussion of which greater than 50% was consultation.  Patient expressed understanding and was in agreement with this plan. She also understands that She can call clinic at any time with any questions, concerns, or complaints.    Jeralyn Ruths, MD   11/15/2015 10:35 AM

## 2015-11-16 ENCOUNTER — Ambulatory Visit
Admission: RE | Admit: 2015-11-16 | Discharge: 2015-11-16 | Disposition: A | Discharge status: Home / Self Care | Payer: Medicaid Other | Source: Ambulatory Visit | Attending: Obstetrics & Gynecology | Admitting: Obstetrics & Gynecology

## 2015-11-16 VITALS — BP 108/56 | HR 72 | Temp 98.4°F | Resp 18 | Wt 185.6 lb

## 2015-11-16 DIAGNOSIS — D696 Thrombocytopenia, unspecified: Secondary | ICD-10-CM

## 2015-11-16 DIAGNOSIS — O09523 Supervision of elderly multigravida, third trimester: Secondary | ICD-10-CM

## 2015-11-16 DIAGNOSIS — O99119 Other diseases of the blood and blood-forming organs and certain disorders involving the immune mechanism complicating pregnancy, unspecified trimester: Secondary | ICD-10-CM

## 2015-11-16 DIAGNOSIS — Z3A34 34 weeks gestation of pregnancy: Secondary | ICD-10-CM

## 2015-11-16 HISTORY — DX: Immune thrombocytopenic purpura: D69.3

## 2015-11-26 ENCOUNTER — Ambulatory Visit (HOSPITAL_BASED_OUTPATIENT_CLINIC_OR_DEPARTMENT_OTHER)
Admission: RE | Admit: 2015-11-26 | Discharge: 2015-11-26 | Disposition: A | Discharge status: Home / Self Care | Payer: Medicaid Other | Source: Ambulatory Visit | Attending: Maternal & Fetal Medicine | Admitting: Maternal & Fetal Medicine

## 2015-11-26 ENCOUNTER — Inpatient Hospital Stay
Admit: 2015-11-26 | Discharge: 2015-11-29 | Disposition: A | Discharge status: Home / Self Care | Payer: Medicaid Other | Attending: Obstetrics and Gynecology | Admitting: Obstetrics and Gynecology

## 2015-11-26 ENCOUNTER — Inpatient Hospital Stay
Admission: RE | Admit: 2015-11-26 | Discharge: 2015-11-26 | DRG: 775 | Disposition: A | Discharge status: Home / Self Care | Payer: Medicaid Other | Source: Ambulatory Visit | Attending: Obstetrics and Gynecology | Admitting: Obstetrics and Gynecology

## 2015-11-26 VITALS — BP 121/68 | HR 84 | Temp 98.0°F | Resp 18 | Wt 191.6 lb

## 2015-11-26 DIAGNOSIS — O99019 Anemia complicating pregnancy, unspecified trimester: Secondary | ICD-10-CM

## 2015-11-26 DIAGNOSIS — O99113 Other diseases of the blood and blood-forming organs and certain disorders involving the immune mechanism complicating pregnancy, third trimester: Secondary | ICD-10-CM | POA: Diagnosis present

## 2015-11-26 DIAGNOSIS — O09523 Supervision of elderly multigravida, third trimester: Secondary | ICD-10-CM

## 2015-11-26 DIAGNOSIS — D509 Iron deficiency anemia, unspecified: Secondary | ICD-10-CM | POA: Diagnosis present

## 2015-11-26 DIAGNOSIS — O9912 Other diseases of the blood and blood-forming organs and certain disorders involving the immune mechanism complicating childbirth: Secondary | ICD-10-CM | POA: Diagnosis present

## 2015-11-26 DIAGNOSIS — O9902 Anemia complicating childbirth: Secondary | ICD-10-CM | POA: Diagnosis present

## 2015-11-26 DIAGNOSIS — Z3A38 38 weeks gestation of pregnancy: Secondary | ICD-10-CM | POA: Diagnosis not present

## 2015-11-26 DIAGNOSIS — O99119 Other diseases of the blood and blood-forming organs and certain disorders involving the immune mechanism complicating pregnancy, unspecified trimester: Secondary | ICD-10-CM

## 2015-11-26 DIAGNOSIS — Z3A37 37 weeks gestation of pregnancy: Secondary | ICD-10-CM | POA: Insufficient documentation

## 2015-11-26 DIAGNOSIS — D693 Immune thrombocytopenic purpura: Secondary | ICD-10-CM | POA: Diagnosis present

## 2015-11-26 DIAGNOSIS — D696 Thrombocytopenia, unspecified: Secondary | ICD-10-CM | POA: Insufficient documentation

## 2015-11-26 HISTORY — DX: Supervision of elderly multigravida, unspecified trimester: O09.529

## 2015-11-26 LAB — CBC WITH DIFFERENTIAL/PLATELET
BASOS ABS: 0 10*3/uL (ref 0–0.1)
BASOS PCT: 0 %
EOS PCT: 0 %
Eosinophils Absolute: 0 10*3/uL (ref 0–0.7)
HCT: 32.4 % — ABNORMAL LOW (ref 35.0–47.0)
Hemoglobin: 11.2 g/dL — ABNORMAL LOW (ref 12.0–16.0)
Lymphocytes Relative: 12 %
Lymphs Abs: 0.9 10*3/uL — ABNORMAL LOW (ref 1.0–3.6)
MCH: 32.1 pg (ref 26.0–34.0)
MCHC: 34.7 g/dL (ref 32.0–36.0)
MCV: 92.6 fL (ref 80.0–100.0)
MONO ABS: 0.2 10*3/uL (ref 0.2–0.9)
Monocytes Relative: 3 %
NEUTROS ABS: 6.3 10*3/uL (ref 1.4–6.5)
Neutrophils Relative %: 85 %
PLATELETS: 113 10*3/uL — AB (ref 150–440)
RBC: 3.5 MIL/uL — AB (ref 3.80–5.20)
RDW: 14.3 % (ref 11.5–14.5)
WBC: 7.5 10*3/uL (ref 3.6–11.0)

## 2015-11-26 LAB — TYPE AND SCREEN
ABO/RH(D): B POS
ANTIBODY SCREEN: NEGATIVE

## 2015-11-26 LAB — CBC
HEMATOCRIT: 32.6 % — AB (ref 35.0–47.0)
HEMOGLOBIN: 11.1 g/dL — AB (ref 12.0–16.0)
MCH: 31.3 pg (ref 26.0–34.0)
MCHC: 34 g/dL (ref 32.0–36.0)
MCV: 92.1 fL (ref 80.0–100.0)
PLATELETS: 100 10*3/uL — AB (ref 150–440)
RBC: 3.55 MIL/uL — ABNORMAL LOW (ref 3.80–5.20)
RDW: 14.6 % — ABNORMAL HIGH (ref 11.5–14.5)
WBC: 7.9 10*3/uL (ref 3.6–11.0)

## 2015-11-26 LAB — ABO/RH: ABO/RH(D): B POS

## 2015-11-26 MED ORDER — OXYTOCIN BOLUS FROM INFUSION: 500.0000 mL | INTRAVENOUS | Status: DC

## 2015-11-26 MED ORDER — OXYTOCIN 40 UNITS IN LACTATED RINGERS INFUSION - SIMPLE MED
2.5000 [IU]/h | INTRAVENOUS | Status: DC
Start: 2015-11-26 — End: 2015-11-27
  Filled 2015-11-26: qty 1000

## 2015-11-26 MED ORDER — DINOPROSTONE 10 MG VA INST
10.0000 mg | VAGINAL_INSERT | Freq: Once | VAGINAL | Status: AC
  Administered 2015-11-26: 10 mg via VAGINAL
  Filled 2015-11-26: qty 1

## 2015-11-26 MED ORDER — ONDANSETRON HCL 4 MG/2ML IJ SOLN
4.0000 mg | Freq: Four times a day (QID) | INTRAMUSCULAR | Status: DC | PRN
Start: 2015-11-26 — End: 2015-11-27

## 2015-11-26 MED ORDER — MISOPROSTOL 200 MCG PO TABS
ORAL_TABLET | ORAL | Status: AC
  Filled 2015-11-26: qty 4

## 2015-11-26 MED ORDER — CITRIC ACID-SODIUM CITRATE 334-500 MG/5ML PO SOLN
30.0000 mL | ORAL | Status: DC | PRN
Start: 2015-11-26 — End: 2015-11-27

## 2015-11-26 MED ORDER — LACTATED RINGERS IV SOLN
INTRAVENOUS | Status: DC
  Administered 2015-11-26 – 2015-11-27 (×3): via INTRAVENOUS

## 2015-11-26 MED ORDER — LACTATED RINGERS IV SOLN: INTRAVENOUS | Status: DC

## 2015-11-26 MED ORDER — SODIUM CHLORIDE FLUSH 0.9 % IV SOLN
INTRAVENOUS | Status: AC
  Filled 2015-11-26: qty 10

## 2015-11-26 MED ORDER — ACETAMINOPHEN 325 MG PO TABS: 650.0000 mg | ORAL_TABLET | ORAL | Status: DC | PRN

## 2015-11-26 MED ORDER — LIDOCAINE HCL (PF) 1 % IJ SOLN
INTRAMUSCULAR | Status: AC
  Filled 2015-11-26: qty 30

## 2015-11-26 MED ORDER — LIDOCAINE HCL (PF) 1 % IJ SOLN: 30.0000 mL | INTRAMUSCULAR | Status: DC | PRN

## 2015-11-26 MED ORDER — LACTATED RINGERS IV SOLN: 500.0000 mL | INTRAVENOUS | Status: DC | PRN

## 2015-11-26 MED ORDER — OXYTOCIN 10 UNIT/ML IJ SOLN
INTRAMUSCULAR | Status: AC
  Filled 2015-11-26: qty 2

## 2015-11-26 MED ORDER — BUTORPHANOL TARTRATE 1 MG/ML IJ SOLN
1.0000 mg | INTRAMUSCULAR | Status: DC | PRN
  Administered 2015-11-27 (×4): 1 mg via INTRAVENOUS
  Filled 2015-11-26 (×4): qty 1

## 2015-11-26 MED ORDER — TERBUTALINE SULFATE 1 MG/ML IJ SOLN: 0.2500 mg | Freq: Once | INTRAMUSCULAR | Status: DC | PRN

## 2015-11-26 MED ORDER — AMMONIA AROMATIC IN INHA
RESPIRATORY_TRACT | Status: AC
  Filled 2015-11-26: qty 10

## 2015-11-26 NOTE — Progress Notes (Signed)
[facility] Follow-up Consultation   Chief Complaint: History of thrombocytopenia   HPI: [name] is a 40 y.o. G6P5005 (Full term SVD x 5 with BWs ranging from 6-8 lbs. No hemorrhages.) who is at 7953w6d by 6740w0d [facility] 08/28/2015 who presents for [facility]-up consultation [facility] for a history of thrombocytopenia.  She is here for final delivery planning.   Since her initial visit here 09/03/2015, she has seen Dr. 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 Her platelet count here on 09/03/2015 was 51,000. After receiving a short course of prednisone 80 mg per day, the patient had a platelet count of 125,000 10/27/2015. One week later, on 11/02/2015 when she was off therapy, her platelet count came down to 67,000. He also treated her with IV iron for her anemia.  She reports that yesterday she had a normal NST and normal amniotic fluid volume on ultrasound.  Today, she denies any bleeding. Specifically, she denies nose bleeds, bruising or vaginal bleeding.   Current Medications: Prenatal vitamins   Review of Systems As noted in the History of Present Illness.   Physical Exam:  BP 121/68 mmHg  Pulse 84  Temp(Src) 98 F (36.7 C) (Oral)  Resp 18  Wt 191 lb 9.6 oz (86.909 kg)  SpO2 100%  FHR 150 bpm  The remainder of the exam was deferred.   US 11/16/2015 - Cephalic, EFW  2842g (43%) and normal AFV was observed.  Suboptimal views of spine, abdominal cord insertion, hands and feet were obtained secondary to advanced gestational age and fetal position.   There was no evidence of placenta previa.  CBC    Component Value Date/Time   WBC 7.9 11/26/2015 0930   RBC 3.55* 11/26/2015 0930   RBC 3.70* 09/03/2015 1221   HGB 11.1* 11/26/2015 0930   HCT 32.6* 11/26/2015 0930   PLT 100* 11/26/2015 0930   MCV 92.1 11/26/2015 0930   MCH 31.3 11/26/2015 0930   MCHC 34.0 11/26/2015 0930   RDW 14.6* 11/26/2015 0930   LYMPHSABS 1.6 11/02/2015  1258   MONOABS 0.5 11/02/2015 1258   EOSABS 0.0 11/02/2015 1258   BASOSABS 0.0 11/02/2015 1258     Asessement:  40 yo gravida 6 para 5005 at 2353w6d gestation with:  1. Thrombocytopenia in pregnancy due to ITP - now on steroids with platelet count of 100,000  2. AMA  3. Anemia - S/P iron infusion - now with Hb of 11.1   Recommendations:  1. Thrombocytopenia in pregnancy due to ITP - now on steroids with platelet count of 100,000  -- She should have a platelet count of at least 50K for delivery and, since a regional anesthetic is preferred to general in case of emergency, even if she does not think she would want an epidural, her platelet count should be at least 70-100K in anticipation of delivery. (The anesthesiologists [facility] at [facility] prefer a platelet count =/> 100,000 for regional anesthesia.)  -- The patient is taking prednisone 80 mg per day in anticipation of delivery. She  started her prednisone 6 days ago [redacted] weeks gestation (April 7).  The plan has been that she will be delivered as soon as her platelet count is in target range (=/>100,000) after starting the prednisone. This will help assure that she has an adequate platelet count for delivery and regional anesthesia, and will help minimize her exposure to high dose steroids.  -- I spoke to Milon Scorearon Jones CNM who is covering  call for [facility].  She accepted the patient for induction today. -- The patient  should continue her daily prednisone at her current dose until she is delivered.  She does not need stress dose steroids. -- Intrapartum and postpartum, the patient has two risk factors for thrombosis - ITP and AMA. I would suggest pneumatic compression devices in active labor and until she becomes fully ambulatory.  -- In general, operative vaginal delivery and invasive fetal procedures such as scalp electrode should be avoided, but if operative vaginal delivery is unavoidable, forceps are preferred to vacuum.   -- After delivery, the infant  should have a platelet count obtained.  This could be obtained by requesting a CBC from cord blood (purple topped tube).   -- Postpartum, the patient should decrease her prednisone by 20 mg per week.   In the first week PP she should take 80 mg per day (four 20 mg tablets) In the second week PP she should take 60 mg per day (three 20 mg tablets) In the third week PP she should take 40 mg per day (two 20 mg tablets) In the fourth week PP she should take 20 mg per day (one 20 mg tablet) She has an appointment with Dr. Orlie Dakin on May 22 She should come to [facility] on Dec 14, 2015 for a CBC. 2. AMA - The patient previously declined any genetic screening or testing.  3. Anemia - S/P iron infusion - now with Hb of 11.1   Total time spent with the patient was 30 minutes with greater than 50% spent in counseling and coordination of care.    Argentina Ponder, MD  [facility]

## 2015-11-26 NOTE — Progress Notes (Addendum)
Patient ID: Tama HeadingsHanan Potter, female   DOB: 1976/01/05, 40 y.o.   MRN: [id number] Notes reviewed. Precautions noted .  Pt took her prednisone tonight . IOL started I explained that plt count will be done after delivery and Dr Tildon HuskyHalfon will decide if  PP BTL will be performed.

## 2015-11-26 NOTE — Plan of Care (Signed)
Problem: Education: Goal: Knowledge of Childbirth will improve Outcome: Progressing Pt still feeling lower abdominal cramps since Cervidil placed, insist she can tolerate them, says she will notify RN if discomfort worsens and if needing something for pain.

## 2015-11-26 NOTE — Plan of Care (Signed)
Problem: Education: Goal: Knowledge of Childbirth will improve Outcome: Progressing Pt admitted for IOL, hx idiopathic thrombocytopenia, recent PLT since admission 113. Cervix 1cm on exam, Cervidil placed. Pt reports abd cramps since Cervidil placed, but tolerable, refused offer for pain or sleep. SCD bilateral on. VSS, pt remains awake and alert, oriented to room and unit.

## 2015-11-26 NOTE — H&P (Signed)
Obstetrics Admission History & Physical  Referring Provider: [facility] Primary OBGYN: [facility]  Late to [facility] at 19 weeks Chief Complaint: Here for IOL at 37 6/7 weeks due to thrombocytopenia with platelets now at 100,000. History of Present Illness  40 y.o. [id number] @ [redacted]w[redacted]d (Dating by LMP of 04/08/15 with EDD of 01/13/16 & 25 wks [place] with EDD of 12/11/15 ). Pregnancy complicated by: ITP with low platelet count requiring steroids, Fe def anemia requiring Fe injections, & AMA.  Pt dropped to 51,000 platelet count during pregnancy.  [name] presents for Cervidil IOL.   Review of Systems: Positive for .  Otherwise, her 12 point review of systems is negative or as noted in the History of Present Illness.  Patient Active Problem List   Diagnosis Date Noted  . ITP (idiopathic thrombocytopenic purpura) 11/26/2015  . Thrombocytopenia affecting pregnancy (HCC) 11/26/2015  . [redacted] weeks gestation of pregnancy 11/26/2015  . Iron deficiency anemia 10/26/2015  . Advanced maternal age in multigravida 09/03/2015      PMHx:  Past Medical History  Diagnosis Date  . Anemia   . ITP (idiopathic thrombocytopenic purpura) 2008   PSHx:  Past Surgical History  Procedure Laterality Date  . No past surgeries     Medications:  No prescriptions prior to admission   Allergies: has No Known Allergies. OBHx:  OB History  Gravida Para Term Preterm AB SAB TAB Ectopic Multiple Living  # Outcome Date GA Lbr Len/2nd Weight Sex Delivery Anes PTL Lv  6 Current           5 Term           4 Term           3 Term           2 Term           1 Term               GYNHx:  History of abnormal pap smears: No. History of STIs: No..             FHx: No family history on file. Soc Hx:  Social History   Social History  . Marital Status: Married    Spouse Name: N/A  . Number of Children: N/A  . Years of Education: N/A   Occupational History  . Not on file.   Social  History Main Topics  . Smoking status: Never Smoker   . Smokeless tobacco: Never Used  . Alcohol Use: No  . Drug Use: No  . Sexual Activity: Yes   Other Topics Concern  . Not on file   Social History Narrative   FOB is involved with the pregnancy   Objective   Filed Vitals:   11/26/15 1156  BP: 118/63  Pulse: 71  Temp: 98.6 F (37 C)  Resp: 16   Temp:  [98 F (36.7 C)-98.6 F (37 C)] 98.6 F (37 C) (04/13 1156) Pulse Rate:  [71-84] 71 (04/13 1156) Resp:  [16-18] 16 (04/13 1156) BP: (118-121)/(63-68) 118/63 mmHg (04/13 1156) SpO2:  [100 %] 100 % (04/13 0842) Weight:  [189 lb (85.73 kg)-191 lb 9.6 oz (86.909 kg)] 189 lb (85.73 kg) (04/13 1156) Temp (24hrs), Avg:98.3 F (36.8 C), Min:98 F (36.7 C), Max:98.6 F (37 C)  No intake or output data in the 24 hours ending 11/26/15 1849    Current  Vital Signs 24h Vital Sign Ranges  T 98.6 F (37 C) Temp  Avg: 98.3 F (36.8 C)  Min: 98 F (36.7 C)  Max: 98.6 F (37 C)  BP 118/63 mmHg BP  Min: 118/63  Max: 121/68  HR 71 Pulse  Avg: 77.5  Min: 71  Max: 84  RR 16 Resp  Avg: 17  Min: 16  Max: 18  SaO2     SpO2  Avg: 100 %  Min: 100 %  Max: 100 %       24 Hour I/O Current Shift I/O  Time Ins Outs       EFM:  Toco:   General: Well nourished, well developed female in no acute distress.  Skin:  Warm and dry.  Cardiovascular: S1S2, RRR, No M/R/G. Respiratory:  Clear to auscultation bilateral. Normal respiratory effort. No W/R/R. Abdomen: Gravid Neuro/Psych:  Normal mood and affect.    SVE: 1/50%/vtx Leopolds/EFW:   Labs   Ultrasounds 11/16/15: Vtx, EFW: 2842 g(43%) and normal AFI observed. No evidence of previa   Perinatal info  B pos/ Rubella  immune  / RPR Neg/HIV neg/HepB Surf Ag Neg /TDaP given on 09/15/15 / Flu: declined /pap neg/ GBS Neg/GC/CH Neg/Neg  Assessment & Plan   40 y.o. [id number] @ 4239w6d with signs and symptoms, with likely  IUP: at 37 6/7 weeks for IOL recommended by Dr Fayrene FearingJames, [facility] for ITP  stable at 100,000 GBS:  Analgesia:  SCD's during labor until ambulatory.  P: 1. Do not use a fetal scalp electrode. 2. Forceps vs vacuum if needed. 3. At delivery, a purple topped tube for a CBC from cord blood should be obtained. 4. Infant platelet count. 5. Continue Prednisone at 80 mg po qd. 6. Pt is stable for regional anesthesia per Dr Fayrene FearingJames, [facility]. 7. Continue Fe supplementation as needed. 8. Planning BTL.    Sharee Pimplearon W. Jones, MSN, CNM, FNP [facility]

## 2015-11-26 NOTE — Progress Notes (Signed)
Off monitor, up to dress for discharge.  Discharge instructions reviewed, plan of care reviewed and questions answered.  Signed copy of discharge instructions in hand and one on chart.

## 2015-11-26 NOTE — Progress Notes (Signed)
[facility] at 37.6 wks, d/t low platelets level, states she has been taking medication since April 7th to help increase PLT level. Was seen this morning in hospital, 1cm, GBS normal. Desires Epidural for labor pain mgmt. Expecting boy, breast/formula/undecided about Peds.

## 2015-11-26 NOTE — OB Triage Note (Signed)
Recvd from DPN with thrombocytopenia and a Plt count of 100,000.  To induce labor.

## 2015-11-26 NOTE — Progress Notes (Signed)
Patient is concerned about being here for induction of labor because her husband has broken his foot, cannot drive, and has no way to get here.  [place] live in [place].   Will speak with CNM concerning the option of going home to gather her things and her husband and return for induction.  FHTs are reactive.  Reported concerns to CNM.  Will check cervix.

## 2015-11-26 NOTE — Progress Notes (Signed)
Report given to CNM.  May discharge to home to return this evening for induction of labor.

## 2015-11-26 NOTE — H&P (Signed)
Pt has now arrived for IOL with Cervidil. UC: q 4-5 mins, mold FHT: 140, Cat 1, +accels, no decels, mod variability. Disc the risks., benefits and alternatives of labor IOL and pt accepts the risk of bleeding, infection, at risk for hemorrhage, failed IOL, fetal intolerance and risk of anesthesia. Pt agrees with IOL and is mostly sad as her husband broke his Rt foot on Tues and cannot come to hospital. Pt wants epidural. Plan: Do not use IFSE, vacuum (prefer forceps) and hopefully, do not perform a LTCS due to risk of bleeding. Pt will be started on SCD's. P: Cervidil 10 mg per vagina and allow pt to rest. 2. Can start Pitocin drip in am if needed. 3. Fetal and uterine toco to monitor labor. 4. Continue to monitor VS.  5. CBC and type and screen now. 6. Report to [facility] re: plan for epidural. 7. Dr Feliberto GottronSchermerhorn aware of pt status and agrees with management plan.

## 2015-11-27 ENCOUNTER — Encounter: Payer: Self-pay | Admitting: Anesthesiology

## 2015-11-27 ENCOUNTER — Encounter: Payer: Self-pay | Admitting: *Deleted

## 2015-11-27 LAB — CBC
HCT: 33.9 % — ABNORMAL LOW (ref 35.0–47.0)
HEMOGLOBIN: 11.6 g/dL — AB (ref 12.0–16.0)
MCH: 31.3 pg (ref 26.0–34.0)
MCHC: 34.2 g/dL (ref 32.0–36.0)
MCV: 91.6 fL (ref 80.0–100.0)
PLATELETS: 97 10*3/uL — AB (ref 150–440)
RBC: 3.71 MIL/uL — AB (ref 3.80–5.20)
RDW: 14.6 % — AB (ref 11.5–14.5)
WBC: 9.2 10*3/uL (ref 3.6–11.0)

## 2015-11-27 LAB — PROTIME-INR
INR: 1.07
Prothrombin Time: 14.1 seconds (ref 11.4–15.0)

## 2015-11-27 LAB — CBC WITH DIFFERENTIAL/PLATELET
BASOS ABS: 0 10*3/uL (ref 0–0.1)
Basophils Relative: 0 %
EOS ABS: 0 10*3/uL (ref 0–0.7)
HCT: 34 % — ABNORMAL LOW (ref 35.0–47.0)
Hemoglobin: 11.8 g/dL — ABNORMAL LOW (ref 12.0–16.0)
LYMPHS ABS: 1.7 10*3/uL (ref 1.0–3.6)
MCH: 32 pg (ref 26.0–34.0)
MCHC: 34.6 g/dL (ref 32.0–36.0)
MCV: 92.6 fL (ref 80.0–100.0)
MONO ABS: 0.6 10*3/uL (ref 0.2–0.9)
Monocytes Relative: 7 %
Neutro Abs: 7.5 10*3/uL — ABNORMAL HIGH (ref 1.4–6.5)
Neutrophils Relative %: 76 %
PLATELETS: 94 10*3/uL — AB (ref 150–440)
RBC: 3.67 MIL/uL — ABNORMAL LOW (ref 3.80–5.20)
RDW: 14.8 % — AB (ref 11.5–14.5)
WBC: 9.9 10*3/uL (ref 3.6–11.0)

## 2015-11-27 LAB — FIBRINOGEN: Fibrinogen: 369 mg/dL (ref 210–470)

## 2015-11-27 LAB — APTT: APTT: 24 s (ref 24–36)

## 2015-11-27 MED ORDER — ONDANSETRON HCL 4 MG/2ML IJ SOLN: 4.0000 mg | INTRAMUSCULAR | Status: DC | PRN

## 2015-11-27 MED ORDER — SENNOSIDES-DOCUSATE SODIUM 8.6-50 MG PO TABS: 2.0000 | ORAL_TABLET | ORAL | Status: DC

## 2015-11-27 MED ORDER — DIBUCAINE 1 % RE OINT: 1.0000 "application " | TOPICAL_OINTMENT | RECTAL | Status: DC | PRN

## 2015-11-27 MED ORDER — COCONUT OIL OIL
1.0000 "application " | TOPICAL_OIL | Status: DC | PRN
  Filled 2015-11-27: qty 120

## 2015-11-27 MED ORDER — OXYTOCIN 40 UNITS IN LACTATED RINGERS INFUSION - SIMPLE MED
1.0000 m[IU]/min | INTRAVENOUS | Status: DC
  Administered 2015-11-27: 1 m[IU]/min via INTRAVENOUS

## 2015-11-27 MED ORDER — WITCH HAZEL-GLYCERIN EX PADS: 1.0000 "application " | MEDICATED_PAD | CUTANEOUS | Status: DC | PRN

## 2015-11-27 MED ORDER — SIMETHICONE 80 MG PO CHEW
80.0000 mg | CHEWABLE_TABLET | ORAL | Status: DC | PRN
Start: 2015-11-27 — End: 2015-11-29

## 2015-11-27 MED ORDER — ACETAMINOPHEN 325 MG PO TABS
650.0000 mg | ORAL_TABLET | ORAL | Status: DC | PRN
Start: 2015-11-27 — End: 2015-11-29

## 2015-11-27 MED ORDER — CARBOPROST TROMETHAMINE 250 MCG/ML IM SOLN
INTRAMUSCULAR | Status: AC
  Filled 2015-11-27: qty 1

## 2015-11-27 MED ORDER — IBUPROFEN 600 MG PO TABS
600.0000 mg | ORAL_TABLET | Freq: Four times a day (QID) | ORAL | Status: DC
  Administered 2015-11-27 – 2015-11-29 (×4): 600 mg via ORAL
  Filled 2015-11-27 (×5): qty 1

## 2015-11-27 MED ORDER — SODIUM CHLORIDE 0.9% FLUSH: 3.0000 mL | Freq: Two times a day (BID) | INTRAVENOUS | Status: DC

## 2015-11-27 MED ORDER — SODIUM CHLORIDE 0.9 % IV SOLN: 250.0000 mL | INTRAVENOUS | Status: DC | PRN

## 2015-11-27 MED ORDER — PRENATAL MULTIVITAMIN CH
1.0000 | ORAL_TABLET | Freq: Every day | ORAL | Status: DC
  Administered 2015-11-28 – 2015-11-29 (×2): 1 via ORAL
  Filled 2015-11-27 (×3): qty 1

## 2015-11-27 MED ORDER — PREDNISONE 50 MG PO TABS
80.0000 mg | ORAL_TABLET | Freq: Every day | ORAL | Status: DC
  Administered 2015-11-27: 80 mg via ORAL
  Filled 2015-11-27 (×2): qty 1

## 2015-11-27 MED ORDER — BENZOCAINE-MENTHOL 20-0.5 % EX AERO: 1.0000 "application " | INHALATION_SPRAY | CUTANEOUS | Status: DC | PRN

## 2015-11-27 MED ORDER — PREDNISONE 50 MG PO TABS
80.0000 mg | ORAL_TABLET | Freq: Every day | ORAL | Status: DC
  Administered 2015-11-29: 80 mg via ORAL
  Filled 2015-11-27 (×4): qty 1

## 2015-11-27 MED ORDER — TETANUS-DIPHTH-ACELL PERTUSSIS 5-2.5-18.5 LF-MCG/0.5 IM SUSP: 0.5000 mL | Freq: Once | INTRAMUSCULAR | Status: DC

## 2015-11-27 MED ORDER — SODIUM CHLORIDE 0.9% FLUSH: 3.0000 mL | INTRAVENOUS | Status: DC | PRN

## 2015-11-27 MED ORDER — BENZOCAINE-MENTHOL 20-0.5 % EX AERO
INHALATION_SPRAY | CUTANEOUS | Status: AC
  Filled 2015-11-27: qty 56

## 2015-11-27 MED ORDER — DIPHENHYDRAMINE HCL 25 MG PO CAPS
25.0000 mg | ORAL_CAPSULE | Freq: Four times a day (QID) | ORAL | Status: DC | PRN
Start: 2015-11-27 — End: 2015-11-29

## 2015-11-27 MED ORDER — METHYLERGONOVINE MALEATE 0.2 MG/ML IJ SOLN
INTRAMUSCULAR | Status: AC
  Filled 2015-11-27: qty 1

## 2015-11-27 MED ORDER — PREDNISONE 20 MG PO TABS
80.0000 mg | ORAL_TABLET | Freq: Every day | ORAL | Status: DC
  Filled 2015-11-27 (×2): qty 1

## 2015-11-27 MED ORDER — ZOLPIDEM TARTRATE 5 MG PO TABS: 5.0000 mg | ORAL_TABLET | Freq: Every evening | ORAL | Status: DC | PRN

## 2015-11-27 MED ORDER — ONDANSETRON HCL 4 MG PO TABS: 4.0000 mg | ORAL_TABLET | ORAL | Status: DC | PRN

## 2015-11-27 NOTE — Progress Notes (Signed)
Thirty minute span of tachysystole noted. Pitocin reduced to 1/mu/min

## 2015-11-27 NOTE — Progress Notes (Signed)
Labor progress note:  S/p cervadil On pitocin S/p SROM for clear fluid  S: Pt feeling ctx.   O: Filed Vitals:   11/27/15 1330 11/27/15 1435  BP: 105/54 112/65  Pulse: 64 70  Temp:    Resp:      SVE: 4/80/-2, forebag felt Toco: Q72min  CBC    Component Value Date/Time   WBC 9.9 11/27/2015 1229   RBC 3.67* 11/27/2015 1229   RBC 3.70* 09/03/2015 1221   HGB 11.8* 11/27/2015 1229   HCT 34.0* 11/27/2015 1229   PLT 94* 11/27/2015 1229   MCV 92.6 11/27/2015 1229   MCH 32.0 11/27/2015 1229   MCHC 34.6 11/27/2015 1229   RDW 14.8* 11/27/2015 1229   LYMPHSABS 1.7 11/27/2015 1229   MONOABS 0.6 11/27/2015 1229   EOSABS 0.0 11/27/2015 1229   BASOSABS 0.0 11/27/2015 1229     A/P: 39yo G6P5005 @ 38 wks today with ITP, desiring epidural.  1. ITP - discussion had with MFM [facility] Brittany Potter regarding the patient's platelet count and epidural risks. Brittany Potter felt comfortable with patient receiving an epidural given her diagnosis of ITP (robust platelets), that she was still on prednisone, and that her platelet count was greater than 80K based on practices [facility]. I also reached out to the Hematologist on call at [facility] Brittany Potter who confirmed that 94K in the setting of prednisone would likely be similar to the 113K from yesterday. After a long discussion with Anesthesia, we agreed to check coags and fibrinogen in addition to another platelet count to further discuss epidural placement on this patient.   2. Multiparous pt - will have cytotec, hemabate, and methergine in the room for delivery  3. OB - cont EFM, toco - cont pitocin - though [facility] was fine with transfer if [facility] anesthesia declined placing epidural here, now that patient is SROM'd, this would not be ideal  Ala DachJohanna K Halfon, MD

## 2015-11-27 NOTE — Progress Notes (Signed)
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  S/p cervadil, now on pitocin. Desires epidural.   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  1. ITP and need for epidural - pt desires an epidural and [facility] ok if platelets >100K - currently 2094 but patient has not taken her prednisone today - will administer her prednisone and recheck her CBC in 2 hours  - will discuss with oncoming anesthesia regarding epidural placement - per MFM, pt to continue 80mg  prednisone daily until delivered - no need for stress dose steroids - PP tapering schedule: * 1 week at 80mg  * 2nd week at 60mg  * 3rd week at 40mg  * 4th week at 20mg  - f/u appt w/ Dr Orlie DakinFinnegan on May 22 - cbc w/ [facility] on May 1  2. OB - [facility] as soon as patient has epidural - cont pitocin - No vacuum, no FSE  3. Contraception - desires PP tubal - will f/u cbc pp to determine if elective surgery will be performed (most likely not)  Ala DachJohanna K Vernell Townley, MD  -

## 2015-11-28 LAB — CBC
HCT: 34.8 % — ABNORMAL LOW (ref 35.0–47.0)
Hemoglobin: 12 g/dL (ref 12.0–16.0)
MCH: 32.1 pg (ref 26.0–34.0)
MCHC: 34.5 g/dL (ref 32.0–36.0)
MCV: 93.2 fL (ref 80.0–100.0)
PLATELETS: 114 10*3/uL — AB (ref 150–440)
RBC: 3.73 MIL/uL — ABNORMAL LOW (ref 3.80–5.20)
RDW: 14.8 % — AB (ref 11.5–14.5)
WBC: 13 10*3/uL — ABNORMAL HIGH (ref 3.6–11.0)

## 2015-11-28 LAB — RPR: RPR: NONREACTIVE

## 2015-11-28 NOTE — Progress Notes (Signed)
Post Partum Day 1 Subjective: no complaints, up ad lib, voiding and tolerating PO  Objective: Blood pressure 118/56, pulse 61, temperature 98.1 F (36.7 C), temperature source Oral, resp. rate 18, height 5\' 6"  (1.676 m), weight 85.73 kg (189 lb), SpO2 100 %, unknown if currently breastfeeding.  Physical Exam:  General: alert, cooperative and no distress Lochia: appropriate Uterine Fundus: firm Incision: none DVT Evaluation: No evidence of DVT seen on physical exam.  CBC Latest Ref Rng 11/28/2015 11/27/2015 11/27/2015  WBC 3.6 - 11.0 K/uL 13.0(H) 9.2 9.9  Hemoglobin 12.0 - 16.0 g/dL 09.812.0 11.6(L) 11.8(L)  Hematocrit 35.0 - 47.0 % 34.8(L) 33.9(L) 34.0(L)  Platelets 150 - 440 K/uL 114(L) 97(L) 94(L)    Assessment/Plan: Plan for discharge tomorrow, Breastfeeding and Contraception 6 wk Lapx BTL   Extensive conversation had with patient regarding elective surgery now with postpartum BTL and bleeding risks versus Lapx BTL in 6 weeks. After discussing r/b/a of pp BTL today versus Lapx BTL in 6 weeks, pt opts for Lapx BTL in 6 weeks.    LOS: 2 days   Brittany Potter 11/28/2015, 8:14 AM

## 2015-11-29 MED ORDER — IBUPROFEN 600 MG PO TABS: 600.0000 mg | ORAL_TABLET | Freq: Four times a day (QID) | ORAL | Status: DC

## 2015-11-29 NOTE — Progress Notes (Signed)
Discharge instructions provided.  Pt verbalizes understanding of all instructions and follow-up care.  Prescription given.  Pt discharged to home with infant at 1430 on 11/29/15 via wheelchair by RN. Reynold BowenSusan Paisley Shelisha Gautier, RN 11/29/2015 3:45 PM

## 2015-11-29 NOTE — Discharge Instructions (Signed)
Care After Vaginal Delivery °Congratulations on your new baby!! ° °Refer to this sheet in the next few weeks. These discharge instructions provide you with information on caring for yourself after delivery. Your caregiver may also give you specific instructions. Your treatment has been planned according to the most current medical practices available, but problems sometimes occur. Call your caregiver if you have any problems or questions after you go home. ° °HOME CARE INSTRUCTIONS °· Take over-the-counter or prescription medicines only as directed by your caregiver or pharmacist. °· Do not drink alcohol, especially if you are breastfeeding or taking medicine to relieve pain. °· Do not chew or smoke tobacco. °· Do not use illegal drugs. °· Continue to use good perineal care. Good perineal care includes: °¨ Wiping your perineum from front to back. °¨ Keeping your perineum clean. °· Do not use tampons or douche until your caregiver says it is okay. °· Shower, wash your hair, and take tub baths as directed by your caregiver. °· Wear a well-fitting bra that provides breast support. °· Eat healthy foods. °· Drink enough fluids to keep your urine clear or pale yellow. °· Eat high-fiber foods such as whole grain cereals and breads, brown rice, beans, and fresh fruits and vegetables every day. These foods may help prevent or relieve constipation. °· Follow your caregiver's recommendations regarding resumption of activities such as climbing stairs, driving, lifting, exercising, or traveling. Specifically, no driving for two weeks, so that you are comfortable reacting quickly in an emergency. °· Talk to your caregiver about resuming sexual activities. Resumption of sexual activities is dependent upon your risk of infection, your rate of healing, and your comfort and desire to resume sexual activity. Usually we recommend waiting about six weeks, or until your bleeding stops and you are interested in sex. °· Try to have someone  help you with your household activities and your newborn for at least a few days after you leave the hospital. Even longer is better. °· Rest as much as possible. Try to rest or take a nap when your newborn is sleeping. Sleep deprivation can be very hard after delivery. °· Increase your activities gradually. °· Keep all of your scheduled postpartum appointments. It is very important to keep your scheduled follow-up appointments. At these appointments, your caregiver will be checking to make sure that you are healing physically and emotionally. ° °SEEK MEDICAL CARE IF:  °· You are passing large clots from your vagina.  °· You have a foul smelling discharge from your vagina. °· You have trouble urinating. °· You are urinating frequently. °· You have pain when you urinate. °· You have a change in your bowel movements. °· You have increasing redness, pain, or swelling near your vaginal incision (episiotomy) or vaginal tear. °· You have pus draining from your episiotomy or vaginal tear. °· Your episiotomy or vaginal tear is separating. °· You have painful, hard, or reddened breasts. °· You have a severe headache. °· You have blurred vision or see spots. °· You feel sad or depressed. °· You have thoughts of hurting yourself or your newborn. °· You have questions about your care, the care of your newborn, or medicines. °· You are dizzy or light-headed. °· You have a rash. °· You have nausea or vomiting. °· You were breastfeeding and have not had a menstrual period within 12 weeks after you stopped breastfeeding. °· You are not breastfeeding and have not had a menstrual period by the 12th week after delivery. °· You   have a fever.  SEEK IMMEDIATE MEDICAL CARE IF:   You have persistent pain.  You have chest pain.  You have shortness of breath.  You faint.  You have leg pain.  You have stomach pain.  Your vaginal bleeding saturates two or more sanitary pads in 1 hour.  MAKE SURE YOU:   Understand these  instructions.  Will get help right away if you are not doing well or get worse.   Document Released: 07/29/2000 Document Revised: 12/16/2013 Document Reviewed: 03/28/2012  [facility] Patient Information 2015 [place], [place]. This information is not intended to replace advice given to you by your health care provider. Make sure you discuss any questions you have with your health care provider. Postpartum Care After Vaginal Delivery After you deliver your newborn (postpartum period), the usual stay in the hospital is 24-72 hours. If there were problems with your labor or delivery, or if you have other medical problems, you might be in the hospital longer.  While you are in the hospital, you will receive help and instructions on how to care for yourself and your newborn during the postpartum period.  While you are in the hospital:  Be sure to tell your nurses if you have pain or discomfort, as well as where you feel the pain and what makes the pain worse.  If you had an incision made near your vagina (episiotomy) or if you had some tearing during delivery, the nurses may put ice packs on your episiotomy or tear. The ice packs may help to reduce the pain and swelling.  If you are breastfeeding, you may feel uncomfortable contractions of your uterus for a couple of weeks. This is normal. The contractions help your uterus get back to normal size.  It is normal to have some bleeding after delivery.  For the first 1-3 days after delivery, the flow is red and the amount may be similar to a period.  It is common for the flow to start and stop.  In the first few days, you may pass some small clots. Let your nurses know if you begin to pass large clots or your flow increases.  Do not  flush blood clots down the toilet before having the nurse look at them.  During the next 3-10 days after delivery, your flow should become more watery and pink or brown-tinged in color.  Ten to fourteen days after  delivery, your flow should be a small amount of yellowish-white discharge.  The amount of your flow will decrease over the first few weeks after delivery. Your flow may stop in 6-8 weeks. Most women have had their flow stop by 12 weeks after delivery.  You should change your sanitary pads frequently.  Wash your hands thoroughly with soap and water for at least 20 seconds after changing pads, using the toilet, or before holding or feeding your newborn.  You should feel like you need to empty your bladder within the first 6-8 hours after delivery.  In case you become weak, lightheaded, or faint, call your nurse before you get out of bed for the first time and before you take a shower for the first time.  Within the first few days after delivery, your breasts may begin to feel tender and full. This is called engorgement. Breast tenderness usually goes away within 48-72 hours after engorgement occurs. You may also notice milk leaking from your breasts. If you are not breastfeeding, do not stimulate your breasts. Breast stimulation can make your breasts  produce more milk.  Spending as much time as possible with your newborn is very important. During this time, you and your newborn can feel close and get to know each other. Having your newborn stay in your room (rooming in) will help to strengthen the bond with your newborn. It will give you time to get to know your newborn and become comfortable caring for your newborn.  Your hormones change after delivery. Sometimes the hormone changes can temporarily cause you to feel sad or tearful. These feelings should not last more than a few days. If these feelings last longer than that, you should talk to your caregiver.  If desired, talk to your caregiver about methods of family planning or contraception.  Talk to your caregiver about immunizations. Your caregiver may want you to have the following immunizations before leaving the hospital:  Tetanus,  diphtheria, and pertussis (Tdap) or tetanus and diphtheria (Td) immunization. It is very important that you and your family (including grandparents) or others caring for your newborn are up-to-date with the Tdap or Td immunizations. The Tdap or Td immunization can help protect your newborn from getting ill.  Rubella immunization.  Varicella (chickenpox) immunization.  Influenza immunization. You should receive this annual immunization if you did not receive the immunization during your pregnancy.   This information is not intended to replace advice given to you by your health care provider. Make sure you discuss any questions you have with your health care provider.   Document Released: 05/29/2007 Document Revised: 04/25/2012 Document Reviewed: 03/28/2012 Elsevier Interactive Patient Education [employer]2016 Elsevier Inc.  Call your doctor for increased pain or vaginal bleeding, temperature above 100.4, depression, or concerns.  No strenuous activity or heavy lifting for 6 weeks.  No intercourse, tampons, douching, or enemas for 6 weeks.  No tub baths-showers only.  No driving for 2 weeks or while taking pain medications.  Continue prenatal vitamin and iron.  Increase calories and fluids while breastfeeding.

## 2015-11-29 NOTE — Discharge Summary (Signed)
Obstetric Discharge Summary   Patient ID: Brittany Potter MRN: [id number] DOB/AGE: [birth year] 40 y.o.   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  Patient had an uncomplicated postpartum course.  By time of discharge on PPD#2, her pain was controlled on oral pain medications; she had appropriate lochia and was ambulating, voiding without difficulty and tolerating regular diet.  She was deemed stable for discharge to home.     Labs: CBC Latest Ref Rng 11/28/2015 11/27/2015 11/27/2015  WBC 3.6 - 11.0 K/uL 13.0(H) 9.2 9.9  Hemoglobin 12.0 - 16.0 g/dL 14.7 11.6(L) 11.8(L)  Hematocrit 35.0 - 47.0 % 34.8(L) 33.9(L) 34.0(L)  Platelets 150 - 440 K/uL 114(L) 97(L) 94(L)   B POS  Physical exam:  Blood pressure 115/63, pulse 59, temperature 98.2 F (36.8 C), temperature source Oral, resp. rate 18, height  (1.676 m), weight 85.73 kg (189 lb), SpO2 100 %, unknown if currently breastfeeding. General: alert and no distress Lochia: appropriate Abdomen: soft, NT Uterine Fundus: firm, U-2 Extremities: No evidence of DVT seen on physical exam. No lower extremity edema.  Discharge Instructions: Per After Visit Summary. Discharge Instructions    Call MD for:  difficulty breathing, headache or  visual disturbances    Complete by:  As directed      Call MD for:  extreme fatigue    Complete by:  As directed      Call MD for:  hives    Complete by:  As directed      Call MD for:  persistant dizziness or light-headedness    Complete by:  As directed      Call MD for:  persistant nausea and vomiting    Complete by:  As directed      Call MD for:  redness, tenderness, or signs of infection (pain, swelling, redness, odor or green/yellow discharge around incision site)    Complete by:  As directed      Call MD for:  severe uncontrolled pain    Complete by:  As directed      Call MD for:  temperature >100.4    Complete by:  As directed      Call MD for:    Complete by:  As directed   Call for worsening s/s of depression, suicidal or homicidal thoughts, or unable to care for yourself or your infant     Diet - low sodium heart healthy    Complete by:  As directed      Discharge instructions    Complete by:  As directed   Prednisone Taper: 1st week postpartum: Prednisone  daily (four  tablets) 2nd week postpartum: Prednisone  daily (three  tablets) 3rd week postpartum: Prednisone  daily (two  tablets) 4th week postpartum: Prednisone  daily (one  tablet)     Sexual acrtivity    Complete by:  As directed  No intercourse for 6 weeks          Activity: Advance as tolerated. Pelvic rest for 6 weeks.  Also refer to After Visit Summary Diet: Regular Medications:   Medication List    ASK your doctor about these medications        PREDNISONE (PAK) PO  Take 4 tablets by mouth daily.     prenatal multivitamin Tabs tablet  Take 1 tablet by mouth daily at 12 noon.       -- Postpartum, the patient should decrease her prednisone by 20 mg per week.  In the first week PP she should take 80 mg per day (four 20 mg tablets) In the second week PP she should take 60 mg per day (three 20 mg tablets) In the third week PP she should take 40 mg per day (two  20 mg tablets) In the fourth week PP she should take 20 mg per day (one 20 mg tablet) She has an appointment with Dr. Orlie DakinFinnegan on May 22 [facility] [place] Dec 14, 2015 for a CBC.  Outpatient follow up:   She has an appointment with Dr. Orlie DakinFinnegan on May 22 [facility] [place] [facility] on Dec 14, 2015 for a CBC. Follow-up Information    Follow up with Ala DachJohanna K Halfon, MD. Call in 6 weeks.   Specialty:  Obstetrics and Gynecology   Why:  Postpartum visit and schedule tubal ligation   Contact information:   [address] [place] [phone]       Follow up with [facility]. Call on 12/14/2015.   Specialty:  Perinatology   Why:  Follow-up with Dr. Fayrene FearingJames needs CBC   Contact information:   [address] [place] [id number] [phone]     Postpartum contraception: bilateral tubal ligation at 6 weeks PP with Dr. Tildon HuskyHalfon  Discharged Condition: good  Discharged to: home   Newborn Data:  Baby Boy named "Adam"  Disposition:home with mother  Apgars: APGAR (1 MIN): 8   APGAR (5 MINS): 9   APGAR (10 MINS):    Baby Feeding: Breast  Karena AddisonSigmon, Meredith C, CNM 11/29/2015

## 2015-11-29 NOTE — Progress Notes (Signed)
Prenatal records indicate that pt received TDaP vaccine on 09/15/15. Reynold BowenSusan Paisley Zadok Holaway, RN 11/29/2015 1:28 PM

## 2015-11-29 NOTE — Clinical Social Work Maternal (Signed)
  CLINICAL SOCIAL WORK MATERNAL/CHILD NOTE  Patient Details  Name: Brittany Potter MRN: [id number] Date of Birth: 10-04-1975  Date:  11/29/2015  Clinical Social Worker Initiating Note:   Blima Rich, [place] [phone]) Date/ Time Initiated:  11/29/15/1323     Child's Name:      Legal Guardian:  Mother   Need for Interpreter:  None   Date of Referral:  11/22/15     Reason for Referral:  Other (Comment) (Transportation concerns )   Referral Source:  RN   Address:   ([address] [place] [address])  Phone number:   [phone])   Household Members:  Spouse   Natural Supports (not living in the home):  Extended Family, Immediate Family   Professional Supports:     Employment:     Type of Work:     Education:  Database administrator Resources:  Medicaid   Other Resources:  [facility]   Cultural/Religious Considerations Which May Impact Care:  N/A   Strengths:  Ability to meet basic needs    Risk Factors/Current Problems:  Estate manager/land agent:  Alert , Goal Oriented    Mood/Affect:  Happy , Calm    CSW Assessment: Clinical Education officer, museum (CSW) received a call from [employer] stating that patient needs transprotation home to [employer] and her husband cannot come pick her up. Per RN mother and infant are being discharged today from the hospital. CSW met with patient alone at bedside. Patient was holding infant. Per patient her husband was recently in a car accident and has an ankle fracture and cannot come to pick her up today. Per patient her car is parked in the [facility] emergency parking lot and she has her car keys and a car seat for the infant. Patient requested to drive herself home. CSW met with RN and made her aware of above. Per RN Lars Pinks CNM will have to approve patient to drive home. CSW paged CNM and made her aware above. CNM approved for patient to drive herself home today. Patient was asked to call nurses  station when she arrived home today. Patient reported no other needs at this time. Please reconsult if future social work needs arise. CSW signing off.    CSW Plan/Description:  No Further Intervention Required/No Barriers to Discharge    Elwyn Reach 11/29/2015, 1:26 PM

## 2015-12-10 ENCOUNTER — Ambulatory Visit
Admission: RE | Admit: 2015-12-10 | Discharge: 2015-12-10 | Disposition: A | Discharge status: Home / Self Care | Payer: Medicaid Other | Source: Ambulatory Visit | Attending: Maternal & Fetal Medicine | Admitting: Maternal & Fetal Medicine

## 2015-12-10 VITALS — BP 132/82 | HR 77 | Temp 98.7°F | Resp 18

## 2015-12-10 DIAGNOSIS — O99119 Other diseases of the blood and blood-forming organs and certain disorders involving the immune mechanism complicating pregnancy, unspecified trimester: Secondary | ICD-10-CM | POA: Diagnosis present

## 2015-12-10 DIAGNOSIS — D696 Thrombocytopenia, unspecified: Secondary | ICD-10-CM

## 2015-12-10 DIAGNOSIS — Z3A00 Weeks of gestation of pregnancy not specified: Secondary | ICD-10-CM | POA: Insufficient documentation

## 2015-12-10 DIAGNOSIS — D693 Immune thrombocytopenic purpura: Secondary | ICD-10-CM

## 2015-12-10 LAB — CBC
HEMATOCRIT: 40.1 % (ref 35.0–47.0)
Hemoglobin: 13.4 g/dL (ref 12.0–16.0)
MCH: 31.5 pg (ref 26.0–34.0)
MCHC: 33.4 g/dL (ref 32.0–36.0)
MCV: 94.2 fL (ref 80.0–100.0)
Platelets: 142 10*3/uL — ABNORMAL LOW (ref 150–440)
RBC: 4.26 MIL/uL (ref 3.80–5.20)
RDW: 15.1 % — AB (ref 11.5–14.5)
WBC: 10.1 10*3/uL (ref 3.6–11.0)

## 2016-01-04 ENCOUNTER — Inpatient Hospital Stay: Payer: Medicaid Other | Attending: Oncology

## 2016-01-04 ENCOUNTER — Inpatient Hospital Stay (HOSPITAL_BASED_OUTPATIENT_CLINIC_OR_DEPARTMENT_OTHER): Payer: Medicaid Other | Admitting: Oncology

## 2016-01-04 ENCOUNTER — Inpatient Hospital Stay: Payer: Medicaid Other

## 2016-01-04 VITALS — BP 129/80 | HR 72 | Temp 97.9°F | Resp 18 | Wt 184.7 lb

## 2016-01-04 DIAGNOSIS — D693 Immune thrombocytopenic purpura: Secondary | ICD-10-CM | POA: Insufficient documentation

## 2016-01-04 DIAGNOSIS — Z79899 Other long term (current) drug therapy: Secondary | ICD-10-CM

## 2016-01-04 DIAGNOSIS — D509 Iron deficiency anemia, unspecified: Secondary | ICD-10-CM

## 2016-01-04 LAB — CBC WITH DIFFERENTIAL/PLATELET
Basophils Absolute: 0 10*3/uL (ref 0–0.1)
Basophils Relative: 0 %
EOS ABS: 0 10*3/uL (ref 0–0.7)
EOS PCT: 0 %
HCT: 38.7 % (ref 35.0–47.0)
Hemoglobin: 13.1 g/dL (ref 12.0–16.0)
LYMPHS ABS: 1.6 10*3/uL (ref 1.0–3.6)
LYMPHS PCT: 14 %
MCH: 31 pg (ref 26.0–34.0)
MCHC: 34 g/dL (ref 32.0–36.0)
MCV: 91.4 fL (ref 80.0–100.0)
MONO ABS: 0.5 10*3/uL (ref 0.2–0.9)
Monocytes Relative: 4 %
Neutro Abs: 9.1 10*3/uL — ABNORMAL HIGH (ref 1.4–6.5)
Neutrophils Relative %: 82 %
PLATELETS: 125 10*3/uL — AB (ref 150–440)
RBC: 4.24 MIL/uL (ref 3.80–5.20)
RDW: 14.4 % (ref 11.5–14.5)
WBC: 11.2 10*3/uL — ABNORMAL HIGH (ref 3.6–11.0)

## 2016-01-04 LAB — IRON AND TIBC
IRON: 101 ug/dL (ref 28–170)
Saturation Ratios: 31 % (ref 10.4–31.8)
TIBC: 329 ug/dL (ref 250–450)
UIBC: 228 ug/dL

## 2016-01-04 LAB — FERRITIN: Ferritin: 49 ng/mL (ref 11–307)

## 2016-01-04 NOTE — Progress Notes (Signed)
Patient is here for post delivery (delivery date 11/27/15)  f/u of Thrombocytopenia in pregnancy.

## 2016-01-08 NOTE — Progress Notes (Signed)
[place]  [phone]) [phone] [phone]) [phone]  ID: Brittany Potter OB: 1975-11-17  MR#: [id number]  FMB#:[id number]  Patient Care Team: Catheryn Bacon, CNM as PCP - General (Obstetrics and Gynecology)  CHIEF COMPLAINT:  Chief Complaint  Patient presents with  . ITP  . Anemia    INTERVAL HISTORY: Patient returns to clinic today for repeat laboratory work and further evaluation. She recently gave birth on November 27, 7[birth year] without complication or excessive bleeding. She currently feels well and is asymptomatic. She denies any easy bleeding or bruising. She has no neurologic complaints. She denies any chest pain or shortness of breath. She denies any nausea, vomiting, constipation, or diarrhea. She has no urinary complaints. Patient offers no specific complaints today.  REVIEW OF SYSTEMS:   Review of Systems  Constitutional: Negative for fever and malaise/fatigue.  Respiratory: Negative.   Cardiovascular: Negative.   Gastrointestinal: Negative.  Negative for blood in stool and melena.  Genitourinary: Negative.   Musculoskeletal: Negative.   Neurological: Negative.  Negative for weakness.  Endo/Heme/Allergies: Does not bruise/bleed easily.    As per HPI. Otherwise, a complete review of systems is negatve.  PAST MEDICAL HISTORY: Past Medical History  Diagnosis Date  . Anemia   . ITP (idiopathic thrombocytopenic purpura) 2008  . AMA (advanced maternal age) multigravida 35+     PAST SURGICAL HISTORY: Past Surgical History  Procedure Laterality Date  . No past surgeries      FAMILY HISTORY: Reviewed and unchanged. No reported history of malignancy or chronic disease.     ADVANCED DIRECTIVES:    HEALTH MAINTENANCE: Social History  Substance Use Topics  . Smoking status: Never Smoker   . Smokeless tobacco: Never Used  . Alcohol Use: No     Colonoscopy:  PAP:  Bone density:  Lipid panel:  No Known Allergies  Current Outpatient  Prescriptions  Medication Sig Dispense Refill  . ibuprofen (ADVIL,MOTRIN) 600 MG tablet Take 1 tablet (600 mg total) by mouth every 6 (six) hours. 30 tablet 0  . PREDNISONE, PAK, PO Take 4 tablets by mouth daily.    . Prenatal Vit-Fe Fumarate-FA (PRENATAL MULTIVITAMIN) TABS tablet Take 1 tablet by mouth daily at 12 noon.     No current facility-administered medications for this visit.    OBJECTIVE: Filed Vitals:   01/04/16 1357  BP: 129/80  Pulse: 72  Temp: 97.9 F (36.6 C)  Resp: 18     Body mass index is 29.83 kg/(m^2).    ECOG FS:0 - Asymptomatic  General: Well-developed, well-nourished, no acute distress. Eyes: Pink conjunctiva, anicteric sclera. Lungs: Clear to auscultation bilaterally. Heart: Regular rate and rhythm. No rubs, murmurs, or gallops. Abdomen: Appears appropriate for gestational age. Musculoskeletal: No edema, cyanosis, or clubbing. Neuro: Alert, answering all questions appropriately. Cranial nerves grossly intact. Skin: No rashes or petechiae noted. Psych: Normal affect.   LAB RESULTS:  Lab Results  Component Value Date   NA 137 09/03/2015   K 3.5 09/03/2015   CL 106 09/03/2015   CO2 25 09/03/2015   GLUCOSE 80 09/03/2015   BUN 9 09/03/2015   CREATININE 0.42* 09/03/2015   CALCIUM 8.4* 09/03/2015   PROT 6.5 09/03/2015   ALBUMIN 3.2* 09/03/2015   AST 20 09/03/2015   ALT 20 09/03/2015   ALKPHOS 59 09/03/2015   BILITOT 0.5 09/03/2015   GFRNONAA >60 09/03/2015   GFRAA >60 09/03/2015    Lab Results  Component Value Date   WBC 11.2* 01/04/2016   NEUTROABS 9.1* 01/04/2016  HGB 13.1 01/04/2016   HCT 38.7 01/04/2016   MCV 91.4 01/04/2016   PLT 125* 01/04/2016   Lab Results  Component Value Date   IRON 101 01/04/2016   TIBC 329 01/04/2016   IRONPCTSAT 31 01/04/2016    Lab Results  Component Value Date   FERRITIN 49 01/04/2016     STUDIES: No results found.  ASSESSMENT: Thrombocytopenia.  PLAN:    1. Thrombocytopenia: Consistent  with ITP. Patient reports persistent thrombocytopenia and her previous pregnancies as well as periods of time when she was not pregnant, although we do not have documentation her laboratory work from her medical records in [place]. Patient started her prednisone taper prior to her delivery prior to delivery and reports a normal birth without side effects or excessive bleeding. Platelet count continues to be decreased, but unchanged. No intervention is needed at this time. Patient does not require additional treatment or bone marrow biopsy. Return to clinic in 3 months with repeat laboratory work and further evaluation.  2. Iron deficiency anemia: Resolved. Hemoglobin and iron stores are now within normal limits. No intervention is needed at this time. Return to clinic return to clinic in 3 months as above. 3. Pregnancy: Uneventful. Continued postpartum follow-up with OB/GYN.   Patient expressed understanding and was in agreement with this plan. She also understands that She can call clinic at any time with any questions, concerns, or complaints.    Lloyd Huger, MD   01/08/2016 8:46 AM

## 2016-01-08 NOTE — H&P (Signed)
Pre-Op Note:  Subjective:     Tama HeadingsHanan Potter is a 40 y.o. female who presents for an established patient office visit who is is Gravida: 6, Para: 6 and presents for a postpartum visit. She is 6 weeks postpartum following a spontaneous vaginal delivery. I have fully reviewed the prenatal and intrapartum course. Complications included:desire for tubal sterilization, ITP.  The delivery was at 38+0 gestational weeks.  Anesthesia: none.  Surgery included: no tears or lacerations  Saw her hematologist. Patient is doing well from her ITP. Postpartum course has been uncomplicated. Bleeding thin lochia. Bowel function is normal. Bladder function is normal. Patient is not sexually active. Contraception plan is for interim tubal ligation. Postpartum depression screening: negative.  Domestic violence screen negative.   The baby is feeding by breast.  Newborn complications none.    Past Medical History:  has a past medical history of Temporary low platelet count (CMS-HCC). Problem List: has Supervision of high risk pregnancy in third trimester; Insufficient prenatal care in second trimester; Advanced maternal age in multigravida, second trimester; History of thrombocytopenia; Immune thrombocytopenia affecting pregnancy in third trimester; Maternal iron deficiency anemia affecting pregnancy in third trimester, antepartum; and Thrombocytopenia during pregnancy (CMS-HCC) on her problem list. Past Surgical History:  has no past surgical history on file. Family History: family history is not on file. Social History:  reports that she has never smoked. She does not have any smokeless tobacco history on file. She reports that she does not drink alcohol or use illicit drugs. Current Medications: has a current medication list which includes the following prescription(s): metronidazole and prenatal vitamin-iron-fa-dha. Prior to encounter Medications:        Current Outpatient Prescriptions on File Prior to Visit   Medication Sig Dispense Refill  . metroNIDAZOLE (METROGEL) 0.75 % vaginal gel Place 1 applicator vaginally nightly. (Patient not taking: Reported on 01/08/2016 ) 70 g 0  . prenatal vitamin-iron-FA-DHA (PRENATE DHA) 27-1-300 mg capsule Take 1 capsule by mouth once daily.     No current facility-administered medications on file prior to visit.    Allergies: has No Known Allergies.  Review of Systems 14 systems reviewed pertinent positives and negatives as noted in the HPI and below.   Objective:    BP 123/77  Pulse 71  Wt 84.4 kg (186 lb)  LMP 04/08/2015 (LMP Unknown)  Breastfeeding? Yes  BMI 30.04 kg/m2  General:  alert, appears stated age and cooperative   Breasts:  inspection negative, no nipple discharge or bleeding, no masses or nodularity palpable  Lungs: clear to auscultation bilaterally  Heart:  regular rate and rhythm, S1, S2 normal, no murmur, click, rub or gallop  Abdomen: soft, non-tender; bowel sounds normal; no masses,  no organomegaly   Vulva:  normal  Vagina: normal vagina, no discharge, exudate, lesion, or erythema  Cervix:  no cervical motion tenderness and no lesions  Corpus: normal size, contour, position, consistency, mobility, non-tender, anteverted and firm  Adnexa:  normal adnexa and no mass, fullness, tenderness  Extremities: normal, no lower extremity peripheral edema and venous stasis, no calf tenderness, negative Homan's sign        Assessment:   39yo G6P6 here for pp visit, doing well. Pregnancy c/b ITP, finishing prednisone taper this week. Platelet count <120K now. Patient desires surgical sterilization by Lapx BTL with filshie clips. .  Patient has been counseled on alternate forms of contraception including hormonal forms, IUD's and barrier methods. She has been counseled on risks of surgical sterilization including bleeding,  infection, pain, injury during procedure, risk of need for further procedures/surgeries due to injury or abnormalities  at the time of surgery, thromboembolic events, exacerbation of ongoing medical conditions, risk of ectopic pregnancy, risk of failure of procedure to prevent pregnancy, medication reactions as well as the risk of anesthesia.  Patient verbalizes understanding.  Consent form signed.  Preoperative and postoperative instructions provided. Written and verbal education provided.  No barriers to learning.  Plan:     1. Contraception: plan for Lapx BTL 2. Book Lapx BTL w/ Filshie clips. Consents signed.   Burnett Corrente, MD

## 2016-01-13 ENCOUNTER — Encounter
Admission: RE | Admit: 2016-01-13 | Discharge: 2016-01-13 | Disposition: A | Discharge status: Home / Self Care | Payer: Medicaid Other | Source: Ambulatory Visit | Attending: Obstetrics and Gynecology | Admitting: Obstetrics and Gynecology

## 2016-01-13 DIAGNOSIS — Z01812 Encounter for preprocedural laboratory examination: Secondary | ICD-10-CM | POA: Diagnosis not present

## 2016-01-13 LAB — CBC
HCT: 39.9 % (ref 35.0–47.0)
Hemoglobin: 13.5 g/dL (ref 12.0–16.0)
MCH: 30.9 pg (ref 26.0–34.0)
MCHC: 33.7 g/dL (ref 32.0–36.0)
MCV: 91.6 fL (ref 80.0–100.0)
PLATELETS: 123 10*3/uL — AB (ref 150–440)
RBC: 4.36 MIL/uL (ref 3.80–5.20)
RDW: 14.1 % (ref 11.5–14.5)
WBC: 9.8 10*3/uL (ref 3.6–11.0)

## 2016-01-13 LAB — TYPE AND SCREEN
ABO/RH(D): B POS
ANTIBODY SCREEN: NEGATIVE
Extend sample reason: UNDETERMINED

## 2016-01-13 NOTE — Pre-Procedure Instructions (Signed)
CBC RESULTS FAXED TO DR HALFON

## 2016-01-13 NOTE — Patient Instructions (Signed)
Your procedure is scheduled on: Friday 01/22/16 Report to Day Surgery. 2ND FLOOR MEDICAL MALL ENTRANCE To find out your arrival time please call [phone] between 1PM - 3PM on Thursday 01/21/16.  Remember: Instructions that are not followed completely may result in serious medical risk, up to and including death, or upon the discretion of your surgeon and anesthesiologist your surgery may need to be rescheduled.    __X__ 1. Do not eat food or drink liquids after midnight. No gum chewing or hard candies.     __X__ 2. No Alcohol for 24 hours before or after surgery.   ____ 3. Bring all medications with you on the day of surgery if instructed.    __X__ 4. Notify your doctor if there is any change in your medical condition     (cold, fever, infections).     Do not wear jewelry, make-up, hairpins, clips or nail polish.  Do not wear lotions, powders, or perfumes.   Do not shave 48 hours prior to surgery. Men may shave face and neck.  Do not bring valuables to the hospital.    [facility] belongings or valuables.               Contacts, dentures or bridgework may not be worn into surgery.  Leave your suitcase in the car. After surgery it may be brought to your room.  For patients admitted to the hospital, discharge time is determined by your                treatment team.   Patients discharged the day of surgery will not be allowed to drive home.   Please read over the following fact sheets that you were given:   Surgical Site Infection Prevention   ____ Take these medicines the morning of surgery with A SIP OF WATER:    1. NONE  2.   3.   4.  5.  6.  ____ Fleet Enema (as directed)   ____ Use CHG Soap as directed  ____ Use inhalers on the day of surgery  ____ Stop metformin 2 days prior to surgery    ____ Take 1/2 of usual insulin dose the night before surgery and none on the morning of surgery.   ____ Stop Coumadin/Plavix/aspirin on   __X__ Stop  Anti-inflammatories on DO NOT TAKE ANY ADVIL IBUPROFEN MOTRIN UNTIL AFTER YOUR SURGERY   ____ Stop supplements until after surgery.    ____ Bring C-Pap to the hospital.

## 2016-01-22 ENCOUNTER — Ambulatory Visit
Admission: RE | Admit: 2016-01-22 | Discharge: 2016-01-22 | Disposition: A | Discharge status: Home / Self Care | Payer: Medicaid Other | Source: Ambulatory Visit | Attending: Obstetrics and Gynecology | Admitting: Obstetrics and Gynecology

## 2016-01-22 ENCOUNTER — Ambulatory Visit: Payer: Medicaid Other | Admitting: Anesthesiology

## 2016-01-22 ENCOUNTER — Encounter
Admission: RE | Disposition: A | Discharge status: Home / Self Care | Payer: Self-pay | Source: Ambulatory Visit | Attending: Obstetrics and Gynecology

## 2016-01-22 DIAGNOSIS — Z862 Personal history of diseases of the blood and blood-forming organs and certain disorders involving the immune mechanism: Secondary | ICD-10-CM | POA: Diagnosis not present

## 2016-01-22 DIAGNOSIS — Z302 Encounter for sterilization: Secondary | ICD-10-CM | POA: Diagnosis present

## 2016-01-22 HISTORY — PX: LAPAROSCOPIC TUBAL LIGATION: SHX1937

## 2016-01-22 LAB — TYPE AND SCREEN
ABO/RH(D): B POS
Antibody Screen: NEGATIVE

## 2016-01-22 LAB — POCT PREGNANCY, URINE: Preg Test, Ur: NEGATIVE

## 2016-01-22 SURGERY — LIGATION, FALLOPIAN TUBE, LAPAROSCOPIC
Anesthesia: General | Laterality: Bilateral

## 2016-01-22 MED ORDER — FENTANYL CITRATE (PF) 100 MCG/2ML IJ SOLN
INTRAMUSCULAR | Status: AC
  Administered 2016-01-22: 25 ug via INTRAVENOUS
  Filled 2016-01-22: qty 2

## 2016-01-22 MED ORDER — GLYCOPYRROLATE 0.2 MG/ML IJ SOLN
INTRAMUSCULAR | Status: DC | PRN
  Administered 2016-01-22: 0.6 mg via INTRAVENOUS

## 2016-01-22 MED ORDER — ONDANSETRON HCL 4 MG/2ML IJ SOLN: 4.0000 mg | Freq: Once | INTRAMUSCULAR | Status: DC | PRN

## 2016-01-22 MED ORDER — NEOSTIGMINE METHYLSULFATE 10 MG/10ML IV SOLN
INTRAVENOUS | Status: DC | PRN
  Administered 2016-01-22: 3 mg via INTRAVENOUS

## 2016-01-22 MED ORDER — KETOROLAC TROMETHAMINE 15 MG/ML IJ SOLN
15.0000 mg | Freq: Four times a day (QID) | INTRAMUSCULAR | Status: DC
  Filled 2016-01-22: qty 1

## 2016-01-22 MED ORDER — OXYCODONE HCL 5 MG PO TABS: 5.0000 mg | ORAL_TABLET | ORAL | Status: DC | PRN

## 2016-01-22 MED ORDER — LIDOCAINE HCL (CARDIAC) 20 MG/ML IV SOLN
INTRAVENOUS | Status: DC | PRN
  Administered 2016-01-22: 100 mg via INTRAVENOUS

## 2016-01-22 MED ORDER — KETOROLAC TROMETHAMINE 15 MG/ML IJ SOLN: 15.0000 mg | Freq: Four times a day (QID) | INTRAMUSCULAR | Status: DC | PRN

## 2016-01-22 MED ORDER — FAMOTIDINE 20 MG PO TABS
20.0000 mg | ORAL_TABLET | Freq: Once | ORAL | Status: AC
  Administered 2016-01-22: 20 mg via ORAL

## 2016-01-22 MED ORDER — BUPIVACAINE HCL (PF) 0.5 % IJ SOLN
INTRAMUSCULAR | Status: AC
  Filled 2016-01-22: qty 30

## 2016-01-22 MED ORDER — SUCCINYLCHOLINE CHLORIDE 20 MG/ML IJ SOLN
INTRAMUSCULAR | Status: DC | PRN
  Administered 2016-01-22: 100 mg via INTRAVENOUS

## 2016-01-22 MED ORDER — MIDAZOLAM HCL 2 MG/2ML IJ SOLN
INTRAMUSCULAR | Status: DC | PRN
  Administered 2016-01-22: 2 mg via INTRAVENOUS

## 2016-01-22 MED ORDER — DEXAMETHASONE SODIUM PHOSPHATE 10 MG/ML IJ SOLN
INTRAMUSCULAR | Status: DC | PRN
  Administered 2016-01-22: 10 mg via INTRAVENOUS

## 2016-01-22 MED ORDER — PROPOFOL 10 MG/ML IV BOLUS
INTRAVENOUS | Status: DC | PRN
  Administered 2016-01-22: 150 mg via INTRAVENOUS

## 2016-01-22 MED ORDER — KETOROLAC TROMETHAMINE 30 MG/ML IJ SOLN
INTRAMUSCULAR | Status: AC
  Administered 2016-01-22: 15 mg
  Filled 2016-01-22: qty 1

## 2016-01-22 MED ORDER — DOCUSATE SODIUM 100 MG PO CAPS: 100.0000 mg | ORAL_CAPSULE | Freq: Two times a day (BID) | ORAL | Status: AC

## 2016-01-22 MED ORDER — FENTANYL CITRATE (PF) 100 MCG/2ML IJ SOLN
INTRAMUSCULAR | Status: DC | PRN
  Administered 2016-01-22: 100 ug via INTRAVENOUS

## 2016-01-22 MED ORDER — LACTATED RINGERS IV SOLN
INTRAVENOUS | Status: DC
  Administered 2016-01-22 (×3): via INTRAVENOUS

## 2016-01-22 MED ORDER — BUPIVACAINE HCL 0.5 % IJ SOLN
INTRAMUSCULAR | Status: DC | PRN
  Administered 2016-01-22: 15 mL

## 2016-01-22 MED ORDER — FAMOTIDINE 20 MG PO TABS
ORAL_TABLET | ORAL | Status: AC
  Administered 2016-01-22: 20 mg via ORAL
  Filled 2016-01-22: qty 1

## 2016-01-22 MED ORDER — FENTANYL CITRATE (PF) 100 MCG/2ML IJ SOLN
25.0000 ug | INTRAMUSCULAR | Status: AC | PRN
  Administered 2016-01-22 (×6): 25 ug via INTRAVENOUS

## 2016-01-22 MED ORDER — ONDANSETRON HCL 4 MG/2ML IJ SOLN
INTRAMUSCULAR | Status: DC | PRN
  Administered 2016-01-22: 4 mg via INTRAVENOUS

## 2016-01-22 MED ORDER — ROCURONIUM BROMIDE 100 MG/10ML IV SOLN
INTRAVENOUS | Status: DC | PRN
  Administered 2016-01-22: 40 mg via INTRAVENOUS
  Administered 2016-01-22: 10 mg via INTRAVENOUS

## 2016-01-22 SURGICAL SUPPLY — 32 items
BLADE SURG SZ11 CARB STEEL (BLADE) ×3 IMPLANT
CATH ROBINSON RED A/P 16FR (CATHETERS) ×3 IMPLANT
CHLORAPREP W/TINT 26ML (MISCELLANEOUS) ×3 IMPLANT
CLIP FILSHIE TUBAL LIGA STRL (Clip) ×3 IMPLANT
CLOSURE WOUND 1/2 X4 (GAUZE/BANDAGES/DRESSINGS)
CLOSURE WOUND 1/4X4 (GAUZE/BANDAGES/DRESSINGS)
GLOVE BIO SURGEON STRL SZ 6 (GLOVE) ×12 IMPLANT
GLOVE INDICATOR 6.5 STRL GRN (GLOVE) ×12 IMPLANT
GOWN STRL REUS W/ TWL LRG LVL3 (GOWN DISPOSABLE) ×2 IMPLANT
GOWN STRL REUS W/TWL LRG LVL3 (GOWN DISPOSABLE) ×4
GRASPER SUT TROCAR 14GX15 (MISCELLANEOUS) ×3 IMPLANT
KIT RM TURNOVER CYSTO AR (KITS) ×3 IMPLANT
LABEL OR SOLS (LABEL) ×3 IMPLANT
LIQUID BAND (GAUZE/BANDAGES/DRESSINGS) ×3 IMPLANT
MANIPULATOR UTERINE 4.5 ZUMI (MISCELLANEOUS) ×3 IMPLANT
NEEDLE VERESS 14GA 120MM (NEEDLE) ×3 IMPLANT
NS IRRIG 500ML POUR BTL (IV SOLUTION) ×3 IMPLANT
PACK GYN LAPAROSCOPIC (MISCELLANEOUS) ×3 IMPLANT
PAD OB MATERNITY 4.3X12.25 (PERSONAL CARE ITEMS) ×3 IMPLANT
PAD PREP 24X41 OB/GYN DISP (PERSONAL CARE ITEMS) ×3 IMPLANT
SLEEVE ENDOPATH XCEL 5M (ENDOMECHANICALS) ×3 IMPLANT
STRIP CLOSURE SKIN 1/2X4 (GAUZE/BANDAGES/DRESSINGS) IMPLANT
STRIP CLOSURE SKIN 1/4X4 (GAUZE/BANDAGES/DRESSINGS) IMPLANT
SUT MON AB 4-0 RB1 27 (SUTURE) ×3 IMPLANT
SUT VIC AB 2-0 UR6 27 (SUTURE) ×3 IMPLANT
SUT VIC AB 4-0 SH 27 (SUTURE) ×2
SUT VIC AB 4-0 SH 27XANBCTRL (SUTURE) ×1 IMPLANT
SWABSTK COMLB BENZOIN TINCTURE (MISCELLANEOUS) ×3 IMPLANT
SYRINGE 10CC LL (SYRINGE) ×3 IMPLANT
TROCAR ENDO BLADELESS 11MM (ENDOMECHANICALS) ×3 IMPLANT
TROCAR XCEL NON-BLD 5MMX100MML (ENDOMECHANICALS) ×3 IMPLANT
TUBING INSUFFLATOR HI FLOW (MISCELLANEOUS) ×3 IMPLANT

## 2016-01-22 NOTE — Anesthesia Procedure Notes (Signed)
Procedure Name: Intubation Date/Time: 01/22/2016 12:51 PM Performed by: Junious SilkNOLES, Patrice Matthew Pre-anesthesia Checklist: Patient identified, Patient being monitored, Timeout performed, Emergency Drugs available and Suction available Patient Re-evaluated:Patient Re-evaluated prior to inductionOxygen Delivery Method: Circle system utilized Preoxygenation: Pre-oxygenation with 100% oxygen Intubation Type: IV induction Ventilation: Mask ventilation without difficulty Laryngoscope Size: Mac and 3 Grade View: Grade I Tube type: Oral Tube size: 7.0 mm Number of attempts: 1 Airway Equipment and Method: Stylet Placement Confirmation: ETT inserted through vocal cords under direct vision,  positive ETCO2 and breath sounds checked- equal and bilateral Secured at: 21 cm Tube secured with: Tape Dental Injury: Teeth and Oropharynx as per pre-operative assessment

## 2016-01-22 NOTE — Anesthesia Postprocedure Evaluation (Signed)
Anesthesia Post Note  Patient: Brittany Potter  Procedure(s) Performed: Procedure(s) (LRB): LAPAROSCOPIC TUBAL LIGATION (Bilateral)  Patient location during evaluation: PACU Anesthesia Type: General Level of consciousness: awake and alert Pain management: pain level controlled Vital Signs Assessment: post-procedure vital signs reviewed and stable Respiratory status: spontaneous breathing, nonlabored ventilation, respiratory function stable and patient connected to nasal cannula oxygen Cardiovascular status: blood pressure returned to baseline and stable Postop Assessment: no signs of nausea or vomiting Anesthetic complications: no    Last Vitals:  Filed Vitals:   01/22/16 1503 01/22/16 1517  BP: 119/76 140/75  Pulse: 52 49  Temp:  36.5 C  Resp: 14 14    Last Pain:  Filed Vitals:   01/22/16 1518  PainSc: 2                  Tenita Cue S

## 2016-01-22 NOTE — Op Note (Signed)
Brittany Potter 01/22/16  PREOPERATIVE DIAGNOSIS:  Undesired fertility  POSTOPERATIVE DIAGNOSIS:  Undesired fertility  PROCEDURE:  Laparoscopic Bilateral Tubal Sterilization using Filshie clips  ANESTHESIA:  General endotracheal  SURGEON: Qusay Villada K. Tysheem Accardo, MD  COMPLICATIONS:  None immediate.  ESTIMATED BLOOD LOSS:  Less than 2 ml.  FLUIDS: 1000 ml LR.  URINE OUTPUT:  50 ml of clear urine.  INDICATIONS: 40 y.o. G6P6  with undesired fertility, desires permanent sterilization. Other reversible forms of contraception were discussed with patient; she declines all other modalities.  Risks of procedure discussed with patient including permanence of method, bleeding, infection, injury to surrounding organs and need for additional procedures including laparotomy, risk of regret.  Failure risk of 0.5-1% with increased risk of ectopic gestation if pregnancy occurs was also discussed with patient.      FINDINGS:  Normal uterus, tubes, and ovaries.  TECHNIQUE:  The patient was taken to the operating room where general anesthesia was obtained without difficulty.  She was then placed in the dorsal lithotomy position and prepared and draped in sterile fashion.  After an adequate timeout was performed, a bivalved speculum was then placed in the patient's vagina, and the anterior lip of cervix grasped with the single-tooth tenaculum.  The uterine manipulator was then advanced into the uterus.  The speculum was removed from the vagina.  Attention was then turned to the patient's abdomen where a 5-mm skin incision was made in the umbilical fold.  The 5-mm trocar and sleeve were then advanced without difficulty with the laparoscope under direct visualization into the abdomen.  The abdomen was then insufflated with carbon dioxide gas and adequate pneumoperitoneum was obtained.  A survey of the patient's pelvis and abdomen revealed entirely normal anatomy however there appeared to be a small rent near the bowel  where the trochar was inserted. After insertion of the 10mm suprapubic trochar 2cm above the symphysis, the area was evaluated with a blunt grasper and the rent was in the epiploic tissue, not the bowel. Intraop consult by Dr Lanae Crumblyalph Elly confirmed this finding. The fallopian tubes were observed and found to be normal in appearance. The Filshie clip applicator was loaded into the abdomen. A Filshie clip was applied to the mid-isthmus portion of the right fallopian tube. The process was repeated for the left fallopian tube.  Local analgesia (0.5% marcaine) was drizzled on both operative sites. The 10mm trochar was removed and the cone was used to close the fascia of the suprapubic port under direct visualization using 2-0 vicryl. The instruments were then removed from the patient's abdomen. Three deep breaths were given from anesthesia. The skin incisions were repaired with 4-monofilament and dermabond with tegraderm gauze dressing.  The uterine manipulator and the tenaculum were removed from the vagina without complications. The patient tolerated the procedure well.  Sponge, lap, and needle counts were correct times two.  The patient was then taken to the recovery room awake, extubated and in stable condition.  Ala DachJohanna K Abram Sax, MD

## 2016-01-22 NOTE — Anesthesia Preprocedure Evaluation (Signed)
Anesthesia Evaluation  Patient identified by MRN, date of birth, ID band Patient awake    Reviewed: Allergy & Precautions, NPO status , Patient's Chart, lab work & pertinent test results, reviewed documented beta blocker date and time   Airway Mallampati: II  TM Distance: >3 FB     Dental  (+) Chipped   Pulmonary           Cardiovascular      Neuro/Psych    GI/Hepatic   Endo/Other    Renal/GU      Musculoskeletal   Abdominal   Peds  Hematology  (+) anemia ,   Anesthesia Other Findings   Reproductive/Obstetrics                             Anesthesia Physical Anesthesia Plan  ASA: II  Anesthesia Plan: General   Post-op Pain Management:    Induction: Intravenous  Airway Management Planned: Oral ETT  Additional Equipment:   Intra-op Plan:   Post-operative Plan:   Informed Consent: I have reviewed the patients History and Physical, chart, labs and discussed the procedure including the risks, benefits and alternatives for the proposed anesthesia with the patient or authorized representative who has indicated his/her understanding and acceptance.     Plan Discussed with: CRNA  Anesthesia Plan Comments:         Anesthesia Quick Evaluation  

## 2016-01-22 NOTE — Interval H&P Note (Signed)
History and Physical Interval Note:  01/22/2016 12:26 PM  Brittany Potter  has presented today for surgery, with the diagnosis of sterilization  The various methods of treatment have been discussed with the patient and family. After consideration of risks, benefits and other options for treatment, the patient has consented to  Procedure(s): LAPAROSCOPIC TUBAL LIGATION (Bilateral) as a surgical intervention .  The patient's history has been reviewed, patient examined, no change in status, stable for surgery.  I have reviewed the patient's chart and labs.  Questions were answered to the patient's satisfaction.     Leonette MostJohanna K Shayanne Gomm

## 2016-01-22 NOTE — Discharge Instructions (Signed)
Laparoscopic Bilateral Tubal Ligation using Filshie clips Refer to this sheet in the next few weeks. These instructions provide you with information on caring for yourself after your procedure. Your caregiver may also give you more specific instructions. Your treatment has been planned according to current medical practices, but problems sometimes occur. Call your caregiver if you have any problems or questions after your procedure. HOME CARE INSTRUCTIONS  Take any medicine as directed by your caregiver. Follow the directions carefully.  Check your incisions every day. REMOVE TOP DRESSING TOMORROW.   Keep the incision area(s) clean and dry - shower as usual. NO BATHS FOR 2 WEEKS. NO SEX FOR 2 WEEKS. NO SWIMMING FOR 2 WEEKS.  Rest as much as possible for the next 3 days. NO HEAVY LIFTING MORE THAN 10LBS FOR 2 WEEKS.  Drink enough fluids to keep your urine clear or pale yellow.  Keep all follow-up appointments. Your caregiver will make sure you are healing the way you should be. SEEK MEDICAL CARE IF:   You have bleeding or discharge from your vagina.  You have pain in your abdomen.  You feel nauseous. SEEK IMMEDIATE MEDICAL CARE IF:   Your incision(s) becomes red, swollen, or tender.  Your incision(s) start(s) bleeding.  You have pus coming from any incision.  You have heavy or persistent vaginal bleeding or discharge.  You have severe or increased abdominal pain.  You cannot stop vomiting.  Your nausea will not go away.  You have a fever. MAKE SURE YOU:  Understand these instructions.  Watch your condition.  Get help right away if you are not doing well or get worse.   This information is not intended to replace advice given to you by your health care provider. Make sure you discuss any questions you have with your health care provider.   Document Released: 07/21/2011 Document Revised: 08/22/2014 Document Reviewed: 07/21/2011 Elsevier Interactive Patient Education  [employer]

## 2016-01-22 NOTE — Transfer of Care (Signed)
Immediate Anesthesia Transfer of Care Note  Patient: Brittany Potter  Procedure(s) Performed: Procedure(s): LAPAROSCOPIC TUBAL LIGATION (Bilateral)  Patient Location: PACU  Anesthesia Type:General  Level of Consciousness: sedated  Airway & Oxygen Therapy: Patient Spontanous Breathing and Patient connected to face mask oxygen  Post-op Assessment: Report given to RN and Post -op Vital signs reviewed and stable  Post vital signs: Reviewed and stable  Last Vitals:  Filed Vitals:   01/22/16 1043  BP: 131/96  Temp: 36.7 C  Resp: 16    Last Pain: There were no vitals filed for this visit.       Complications: No apparent anesthesia complications

## 2016-01-25 ENCOUNTER — Encounter: Payer: Self-pay | Admitting: Obstetrics and Gynecology

## 2016-04-04 NOTE — Progress Notes (Deleted)
[place]  [phone] Fax:(336) [phone]  ID: Brittany Potter OB: [birth year]  MR#: [id number]  YBO#:[id number]  Patient Care Team: Catheryn Bacon, CNM as PCP - General (Obstetrics and Gynecology)  CHIEF COMPLAINT: Thrombocytopenia.  INTERVAL HISTORY: Patient returns to clinic today for repeat laboratory work and further evaluation. She recently gave birth on November 26, 2776 without complication or excessive bleeding. She currently feels well and is asymptomatic. She denies any easy bleeding or bruising. She has no neurologic complaints. She denies any chest pain or shortness of breath. She denies any nausea, vomiting, constipation, or diarrhea. She has no urinary complaints. Patient offers no specific complaints today.  REVIEW OF SYSTEMS:   Review of Systems  Constitutional: Negative for fever and malaise/fatigue.  Respiratory: Negative.   Cardiovascular: Negative.   Gastrointestinal: Negative.  Negative for blood in stool and melena.  Genitourinary: Negative.   Musculoskeletal: Negative.   Neurological: Negative.  Negative for weakness.  Endo/Heme/Allergies: Does not bruise/bleed easily.    As per HPI. Otherwise, a complete review of systems is negatve.  PAST MEDICAL HISTORY: Past Medical History:  Diagnosis Date  . AMA (advanced maternal age) multigravida 42+   . Anemia   . ITP (idiopathic thrombocytopenic purpura) 2008    PAST SURGICAL HISTORY: Past Surgical History:  Procedure Laterality Date  . LAPAROSCOPIC TUBAL LIGATION Bilateral 01/22/2016   Procedure: LAPAROSCOPIC TUBAL LIGATION;  Surgeon: Lorette Ang, MD;  Location: [facility] ORS;  Service: Gynecology;  Laterality: Bilateral;  . NO PAST SURGERIES      FAMILY HISTORY: Reviewed and unchanged. No reported history of malignancy or chronic disease.     ADVANCED DIRECTIVES:    HEALTH MAINTENANCE: Social History  Substance Use Topics  . Smoking status: Never Smoker  . Smokeless  tobacco: Never Used  . Alcohol use No     Colonoscopy:  PAP:  Bone density:  Lipid panel:  No Known Allergies  Current Outpatient Prescriptions  Medication Sig Dispense Refill  . docusate sodium (COLACE) 100 MG capsule Take 1 capsule (100 mg total) by mouth 2 (two) times daily. 30 capsule 0  . ibuprofen (ADVIL,MOTRIN) 600 MG tablet Take 1 tablet (600 mg total) by mouth every 6 (six) hours. 30 tablet 0  . oxyCODONE (ROXICODONE) 5 MG immediate release tablet Take 1 tablet (5 mg total) by mouth every 4 (four) hours as needed for severe pain. 30 tablet 0  . Prenatal Vit-Fe Fumarate-FA (PRENATAL MULTIVITAMIN) TABS tablet Take 1 tablet by mouth daily at 12 noon.     No current facility-administered medications for this visit.     OBJECTIVE: There were no vitals filed for this visit.   There is no height or weight on file to calculate BMI.    ECOG FS:0 - Asymptomatic  General: Well-developed, well-nourished, no acute distress. Eyes: Pink conjunctiva, anicteric sclera. Lungs: Clear to auscultation bilaterally. Heart: Regular rate and rhythm. No rubs, murmurs, or gallops. Abdomen: Appears appropriate for gestational age. Musculoskeletal: No edema, cyanosis, or clubbing. Neuro: Alert, answering all questions appropriately. Cranial nerves grossly intact. Skin: No rashes or petechiae noted. Psych: Normal affect.   LAB RESULTS:  Lab Results  Component Value Date   NA 137 09/03/2015   K 3.5 09/03/2015   CL 106 09/03/2015   CO2 25 09/03/2015   GLUCOSE 80 09/03/2015   BUN 9 09/03/2015   CREATININE 0.42 (L) 09/03/2015   CALCIUM 8.4 (L) 09/03/2015   PROT 6.5 09/03/2015   ALBUMIN 3.2 (L) 09/03/2015  AST 20 09/03/2015   ALT 20 09/03/2015   ALKPHOS 59 09/03/2015   BILITOT 0.5 09/03/2015   GFRNONAA >60 09/03/2015   GFRAA >60 09/03/2015    Lab Results  Component Value Date   WBC 9.8 01/13/2016   NEUTROABS 9.1 (H) 01/04/2016   HGB 13.5 01/13/2016   HCT 39.9 01/13/2016   MCV  91.6 01/13/2016   PLT 123 (L) 01/13/2016   Lab Results  Component Value Date   IRON 101 01/04/2016   TIBC 329 01/04/2016   IRONPCTSAT 31 01/04/2016    Lab Results  Component Value Date   FERRITIN 49 01/04/2016     STUDIES: No results found.  ASSESSMENT: Thrombocytopenia.  PLAN:    1. Thrombocytopenia: Consistent with ITP. Patient reports persistent thrombocytopenia and her previous pregnancies as well as periods of time when she was not pregnant, although we do not have documentation her laboratory work from her medical records in [place]. Patient started her prednisone taper prior to her delivery prior to delivery and reports a normal birth without side effects or excessive bleeding. Platelet count continues to be decreased, but unchanged. No intervention is needed at this time. Patient does not require additional treatment or bone marrow biopsy. Return to clinic in 3 months with repeat laboratory work and further evaluation.  2. Iron deficiency anemia: Resolved. Hemoglobin and iron stores are now within normal limits. No intervention is needed at this time. Return to clinic return to clinic in 3 months as above. 3. Pregnancy: Uneventful. Continued postpartum follow-up with OB/GYN.   Patient expressed understanding and was in agreement with this plan. She also understands that She can call clinic at any time with any questions, concerns, or complaints.    Lloyd Huger, MD   04/04/2016 12:45 AM

## 2016-04-05 ENCOUNTER — Inpatient Hospital Stay: Payer: Medicaid Other

## 2016-04-05 ENCOUNTER — Inpatient Hospital Stay: Payer: Medicaid Other | Admitting: Oncology

## 2016-05-09 NOTE — Progress Notes (Deleted)
[place]  [phone] [phone]  ID: Rema Fendt OB: 07/01/1976  MR#: [id number]  UMP#:[id number]  Patient Care Team: Catheryn Bacon, CNM as PCP - General (Obstetrics and Gynecology)  CHIEF COMPLAINT: ITP.  INTERVAL HISTORY: Patient returns to clinic today for repeat laboratory work and further evaluation. She recently gave birth on November 27, 4006 without complication or excessive bleeding. She currently feels well and is asymptomatic. She denies any easy bleeding or bruising. She has no neurologic complaints. She denies any chest pain or shortness of breath. She denies any nausea, vomiting, constipation, or diarrhea. She has no urinary complaints. Patient offers no specific complaints today.  REVIEW OF SYSTEMS:   Review of Systems  Constitutional: Negative for fever and malaise/fatigue.  Respiratory: Negative.   Cardiovascular: Negative.   Gastrointestinal: Negative.  Negative for blood in stool and melena.  Genitourinary: Negative.   Musculoskeletal: Negative.   Neurological: Negative.  Negative for weakness.  Endo/Heme/Allergies: Does not bruise/bleed easily.    As per HPI. Otherwise, a complete review of systems is negatve.  PAST MEDICAL HISTORY: Past Medical History:  Diagnosis Date  . AMA (advanced maternal age) multigravida 44+   . Anemia   . ITP (idiopathic thrombocytopenic purpura) 2008    PAST SURGICAL HISTORY: Past Surgical History:  Procedure Laterality Date  . LAPAROSCOPIC TUBAL LIGATION Bilateral 01/22/2016   Procedure: LAPAROSCOPIC TUBAL LIGATION;  Surgeon: Lorette Ang, MD;  Location: [facility] ORS;  Service: Gynecology;  Laterality: Bilateral;  . NO PAST SURGERIES      FAMILY HISTORY: Reviewed and unchanged. No reported history of malignancy or chronic disease.     ADVANCED DIRECTIVES:    HEALTH MAINTENANCE: Social History  Substance Use Topics  . Smoking status: Never Smoker  . Smokeless tobacco: Never  Used  . Alcohol use No     Colonoscopy:  PAP:  Bone density:  Lipid panel:  No Known Allergies  Current Outpatient Prescriptions  Medication Sig Dispense Refill  . docusate sodium (COLACE) 100 MG capsule Take 1 capsule (100 mg total) by mouth 2 (two) times daily. 30 capsule 0  . ibuprofen (ADVIL,MOTRIN) 600 MG tablet Take 1 tablet (600 mg total) by mouth every 6 (six) hours. 30 tablet 0  . oxyCODONE (ROXICODONE) 5 MG immediate release tablet Take 1 tablet (5 mg total) by mouth every 4 (four) hours as needed for severe pain. 30 tablet 0  . Prenatal Vit-Fe Fumarate-FA (PRENATAL MULTIVITAMIN) TABS tablet Take 1 tablet by mouth daily at 12 noon.     No current facility-administered medications for this visit.     OBJECTIVE: There were no vitals filed for this visit.   There is no height or weight on file to calculate BMI.    ECOG FS:0 - Asymptomatic  General: Well-developed, well-nourished, no acute distress. Eyes: Pink conjunctiva, anicteric sclera. Lungs: Clear to auscultation bilaterally. Heart: Regular rate and rhythm. No rubs, murmurs, or gallops. Abdomen: Appears appropriate for gestational age. Musculoskeletal: No edema, cyanosis, or clubbing. Neuro: Alert, answering all questions appropriately. Cranial nerves grossly intact. Skin: No rashes or petechiae noted. Psych: Normal affect.   LAB RESULTS:  Lab Results  Component Value Date   NA 137 09/03/2015   K 3.5 09/03/2015   CL 106 09/03/2015   CO2 25 09/03/2015   GLUCOSE 80 09/03/2015   BUN 9 09/03/2015   CREATININE 0.42 (L) 09/03/2015   CALCIUM 8.4 (L) 09/03/2015   PROT 6.5 09/03/2015   ALBUMIN 3.2 (L) 09/03/2015  AST 20 09/03/2015   ALT 20 09/03/2015   ALKPHOS 59 09/03/2015   BILITOT 0.5 09/03/2015   GFRNONAA >60 09/03/2015   GFRAA >60 09/03/2015    Lab Results  Component Value Date   WBC 9.8 01/13/2016   NEUTROABS 9.1 (H) 01/04/2016   HGB 13.5 01/13/2016   HCT 39.9 01/13/2016   MCV 91.6 01/13/2016     PLT 123 (L) 01/13/2016   Lab Results  Component Value Date   IRON 101 01/04/2016   TIBC 329 01/04/2016   IRONPCTSAT 31 01/04/2016    Lab Results  Component Value Date   FERRITIN 49 01/04/2016     STUDIES: No results found.  ASSESSMENT: ITP  PLAN:    1. Thrombocytopenia: Consistent with ITP. Patient reports persistent thrombocytopenia and her previous pregnancies as well as periods of time when she was not pregnant, although we do not have documentation her laboratory work from her medical records in [place]. Patient started her prednisone taper prior to her delivery prior to delivery and reports a normal birth without side effects or excessive bleeding. Platelet count continues to be decreased, but unchanged. No intervention is needed at this time. Patient does not require additional treatment or bone marrow biopsy. Return to clinic in 3 months with repeat laboratory work and further evaluation.  2. Iron deficiency anemia: Resolved. Hemoglobin and iron stores are now within normal limits. No intervention is needed at this time. Return to clinic return to clinic in 3 months as above. 3. Pregnancy: Uneventful. Continued postpartum follow-up with OB/GYN.   Patient expressed understanding and was in agreement with this plan. She also understands that She can call clinic at any time with any questions, concerns, or complaints.    Lloyd Huger, MD   05/09/2016 10:53 PM

## 2016-05-10 ENCOUNTER — Inpatient Hospital Stay: Payer: Medicaid Other | Admitting: Oncology

## 2016-05-10 ENCOUNTER — Inpatient Hospital Stay: Payer: Medicaid Other

## 2016-06-07 ENCOUNTER — Other Ambulatory Visit: Payer: Medicaid Other

## 2016-06-07 ENCOUNTER — Ambulatory Visit: Payer: Medicaid Other | Admitting: Oncology

## 2016-06-07 ENCOUNTER — Ambulatory Visit: Payer: Medicaid Other

## 2017-02-17 ENCOUNTER — Emergency Department (HOSPITAL_BASED_OUTPATIENT_CLINIC_OR_DEPARTMENT_OTHER)
Admission: EM | Admit: 2017-02-17 | Discharge: 2017-02-17 | Disposition: A | Discharge status: Home / Self Care | Payer: Worker's Compensation | Attending: Emergency Medicine | Admitting: Emergency Medicine

## 2017-02-17 ENCOUNTER — Emergency Department (HOSPITAL_BASED_OUTPATIENT_CLINIC_OR_DEPARTMENT_OTHER): Payer: Worker's Compensation

## 2017-02-17 ENCOUNTER — Encounter (HOSPITAL_BASED_OUTPATIENT_CLINIC_OR_DEPARTMENT_OTHER): Payer: Self-pay | Admitting: Emergency Medicine

## 2017-02-17 DIAGNOSIS — Y939 Activity, unspecified: Secondary | ICD-10-CM | POA: Diagnosis not present

## 2017-02-17 DIAGNOSIS — Y990 Civilian activity done for income or pay: Secondary | ICD-10-CM | POA: Insufficient documentation

## 2017-02-17 DIAGNOSIS — Y9289 Other specified places as the place of occurrence of the external cause: Secondary | ICD-10-CM | POA: Insufficient documentation

## 2017-02-17 DIAGNOSIS — Z23 Encounter for immunization: Secondary | ICD-10-CM | POA: Diagnosis not present

## 2017-02-17 DIAGNOSIS — W230XXA Caught, crushed, jammed, or pinched between moving objects, initial encounter: Secondary | ICD-10-CM | POA: Diagnosis not present

## 2017-02-17 DIAGNOSIS — S6991XA Unspecified injury of right wrist, hand and finger(s), initial encounter: Secondary | ICD-10-CM | POA: Diagnosis present

## 2017-02-17 DIAGNOSIS — S6721XA Crushing injury of right hand, initial encounter: Secondary | ICD-10-CM | POA: Insufficient documentation

## 2017-02-17 MED ORDER — IBUPROFEN 800 MG PO TABS: 800.0000 mg | ORAL_TABLET | Freq: Three times a day (TID) | ORAL | 0 refills | Status: DC | PRN

## 2017-02-17 MED ORDER — TRAMADOL HCL 50 MG PO TABS: 50.0000 mg | ORAL_TABLET | Freq: Four times a day (QID) | ORAL | 0 refills | Status: DC | PRN

## 2017-02-17 MED ORDER — TETANUS-DIPHTH-ACELL PERTUSSIS 5-2.5-18.5 LF-MCG/0.5 IM SUSP
0.5000 mL | Freq: Once | INTRAMUSCULAR | Status: AC
  Administered 2017-02-17: 0.5 mL via INTRAMUSCULAR
  Filled 2017-02-17: qty 0.5

## 2017-02-17 MED ORDER — OXYCODONE-ACETAMINOPHEN 5-325 MG PO TABS
1.0000 | ORAL_TABLET | Freq: Once | ORAL | Status: AC
  Administered 2017-02-17: 1 via ORAL
  Filled 2017-02-17: qty 1

## 2017-02-17 NOTE — ED Triage Notes (Addendum)
Reports injury to 3rd, 4th and 5th digit.  States this occurred at work when trying to get paper off the roller.  No bleeding at present.  Per manager no drug screen needed.

## 2017-02-17 NOTE — Discharge Instructions (Signed)
You were seen in the ED with a crush injury to the hand. There is no evidence of broken bone on x-ray. Keep the hand wounds clean and dry. Apply ice of the next 24 hours and keep the hand elevated. Follow up with your PCP on Monday for re-evaluation.   Return to the ED with any new or worsening pain or other concerning symptoms.

## 2017-02-17 NOTE — ED Provider Notes (Signed)
Emergency Department Provider Note   I have reviewed the triage vital signs and the nursing notes.   HISTORY  Chief Complaint Finger Injury   HPI Brittany Potter is a 41 y.o. female with PMH of ITP, right hand dominant, presents to the emergency department for evaluation of right hand pain. The patient was at work when her right hand was pulled into a machine. She describes a large paper roll her with something stuck in it. She states she tried to remove that and her hand was suddenly pulled into the machine. A coworker was able to shut the machine down but most of her fingers on the right hand were pulled into the roller. She had very small amount of bleeding on scene but that stopped spontaneously. She denies any wrist pain or elbow pain. No other part of her body was pulled into the machine. She denies any tingling or numbness in the fingers but has severe pain in fingers 3-5, worse with movement. No radiation of pain.    Past Medical History:  Diagnosis Date  . AMA (advanced maternal age) multigravida 35+   . Anemia   . ITP (idiopathic thrombocytopenic purpura) 2008    Patient Active Problem List   Diagnosis Date Noted  . ITP (idiopathic thrombocytopenic purpura) 11/26/2015  . Thrombocytopenia affecting pregnancy (HCC) 11/26/2015  . [redacted] weeks gestation of pregnancy 11/26/2015  . Indication for care in labor and delivery, antepartum 11/26/2015  . Iron deficiency anemia 10/26/2015  . Advanced maternal age in multigravida 09/03/2015    Past Surgical History:  Procedure Laterality Date  . LAPAROSCOPIC TUBAL LIGATION Bilateral 01/22/2016   Procedure: LAPAROSCOPIC TUBAL LIGATION;  Surgeon: Ala DachJohanna K Halfon, MD;  Location: [facility] ORS;  Service: Gynecology;  Laterality: Bilateral;  . NO PAST SURGERIES      Current Outpatient Rx  . Order #: [id number] Class: Normal  . Order #: [id number] Class: Print  . Order #: [id number] Class: Print  . Order #: [id number] Class: Historical Med  .  Order #: [id number] Class: Print    Allergies Patient has no known allergies.  History reviewed. No pertinent family history.  Social History Social History  Substance Use Topics  . Smoking status: Never Smoker  . Smokeless tobacco: Never Used  . Alcohol use No    Review of Systems  Constitutional: No fever/chills Eyes: No visual changes. ENT: No sore throat. Cardiovascular: Denies chest pain. Respiratory: Denies shortness of breath. Gastrointestinal: No abdominal pain.  No nausea, no vomiting.  No diarrhea.  No constipation. Genitourinary: Negative for dysuria. Musculoskeletal: Negative for back pain. Positive right hand/finger pain.  Skin: Negative for rash. Positive skin tares to fingers.  Neurological: Negative for headaches, focal weakness or numbness.  10-point ROS otherwise negative.  ____________________________________________   PHYSICAL EXAM:  VITAL SIGNS: ED Triage Vitals  Enc Vitals Group     BP 02/17/17 1655 (!) 151/95     Pulse Rate 02/17/17 1655 89     Resp 02/17/17 1655 16     Temp 02/17/17 1655 97.7 F (36.5 C)     Temp Source 02/17/17 1655 Oral     SpO2 02/17/17 1655 100 %     Weight 02/17/17 1656 178 lb (80.7 kg)     Height 02/17/17 1656 5\' 6"  (1.676 m)     Pain Score 02/17/17 1656 10    Constitutional: Alert and oriented. Well appearing and in no acute distress. Eyes: Conjunctivae are normal.  Head: Atraumatic. Nose: No congestion/rhinnorhea. Mouth/Throat: Mucous membranes are moist.  Neck:  No stridor. Cardiovascular: Normal rate, regular rhythm. Good peripheral circulation. Grossly normal heart sounds.   Respiratory: Normal respiratory effort.  No retractions. Lungs CTAB. Musculoskeletal: No lower extremity tenderness nor edema. Right 3rd-5th finger swelling with limited ROM.  Neurologic:  Normal speech and language. No gross focal neurologic deficits are appreciated.  Skin:  Skin is warm and dry. Erythema over right 3rd-5th fingers.  Multiple areas of abrasion to fingers with no active bleeding or laceration.  Psychiatric: Mood and affect are normal. Speech and behavior are normal.  ____________________________________________  RADIOLOGY  Dg Hand Complete Right  Result Date: 02/17/2017 CLINICAL DATA:  Acute right hand pain following machine injury. Initial encounter. EXAM: RIGHT HAND - COMPLETE 3+ VIEW COMPARISON:  None. FINDINGS: There is no evidence of fracture or dislocation. There is no evidence of arthropathy or other focal bone abnormality. Soft tissues are unremarkable. IMPRESSION: Negative. Electronically Signed   By: Harmon Pier M.D.   On: 02/17/2017 17:49    ____________________________________________   PROCEDURES  Procedure(s) performed:   Procedures  None ____________________________________________   INITIAL IMPRESSION / ASSESSMENT AND PLAN / ED COURSE  Pertinent labs & imaging results that were available during my care of the patient were reviewed by me and considered in my medical decision making (see chart for details).  Patient presents to the emergency department for evaluation of swelling and mild erythema in fingers 3 through 5 on the right hand. She has several areas of superficial abrasion/mild avulsion. No lacerations. No gaping wounds. No subungual hematomas or clinical concern for nail bed laceration. She has no tenderness over the wrist. No snuffbox tenderness. No elbow discomfort. Plan for x-ray of the right hand along with pain medication. With her abrasion/skin tearing plan for updating tetanus.   Patient with no acute fracture on x-ray. Wound was dressed and wound mgmt discussed in detail. Tetanus updated. Discussed PCP f/u and wound care in detail.   At this time, I do not feel there is any life-threatening condition present. I have reviewed and discussed all results (EKG, imaging, lab, urine as appropriate), exam findings with patient. I have reviewed nursing notes and appropriate  previous records.  I feel the patient is safe to be discharged home without further emergent workup. Discussed usual and customary return precautions. Patient and family (if present) verbalize understanding and are comfortable with this plan.  Patient will follow-up with their primary care provider. If they do not have a primary care provider, information for follow-up has been provided to them. All questions have been answered.  ____________________________________________  FINAL CLINICAL IMPRESSION(S) / ED DIAGNOSES  Final diagnoses:  Crushing injury of right hand, initial encounter     MEDICATIONS GIVEN DURING THIS VISIT:  Medications  Tdap (BOOSTRIX) injection 0.5 mL (0.5 mLs Intramuscular Given 02/17/17 1750)  oxyCODONE-acetaminophen (PERCOCET/ROXICET) 5-325 MG per tablet 1 tablet (1 tablet Oral Given 02/17/17 1750)     NEW OUTPATIENT MEDICATIONS STARTED DURING THIS VISIT:  Discharge Medication List as of 02/17/2017  6:06 PM    START taking these medications   Details  traMADol (ULTRAM) 50 MG tablet Take 1 tablet (50 mg total) by mouth every 6 (six) hours as needed., Starting Fri 02/17/2017, Print         Note:  This document was prepared using Dragon voice recognition software and may include unintentional dictation errors.  Alona Bene, MD Emergency Medicine    Long, Arlyss Repress, MD 02/18/17 [phone]

## 2019-05-20 IMAGING — CR DG HAND COMPLETE 3+V*R*
3 series · 3 of 3 positions shown · non-contrast
Comparison: None.

CLINICAL DATA: Acute right hand pain following machine injury.
Initial encounter.

EXAM:
RIGHT HAND - COMPLETE 3+ VIEW

[x hand pa right]
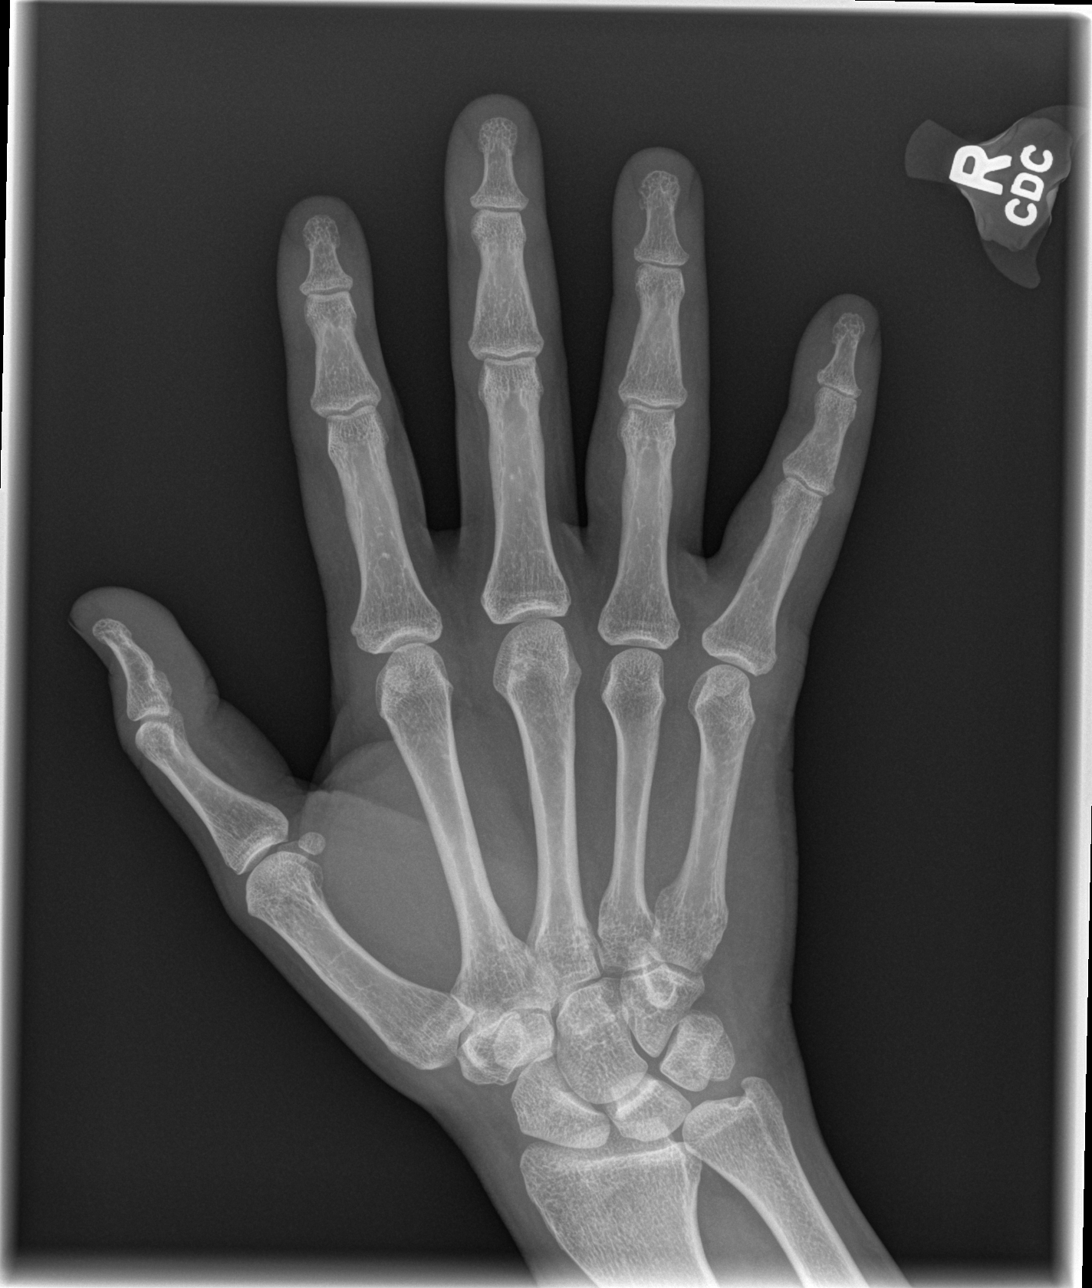

[x hand oblique right]
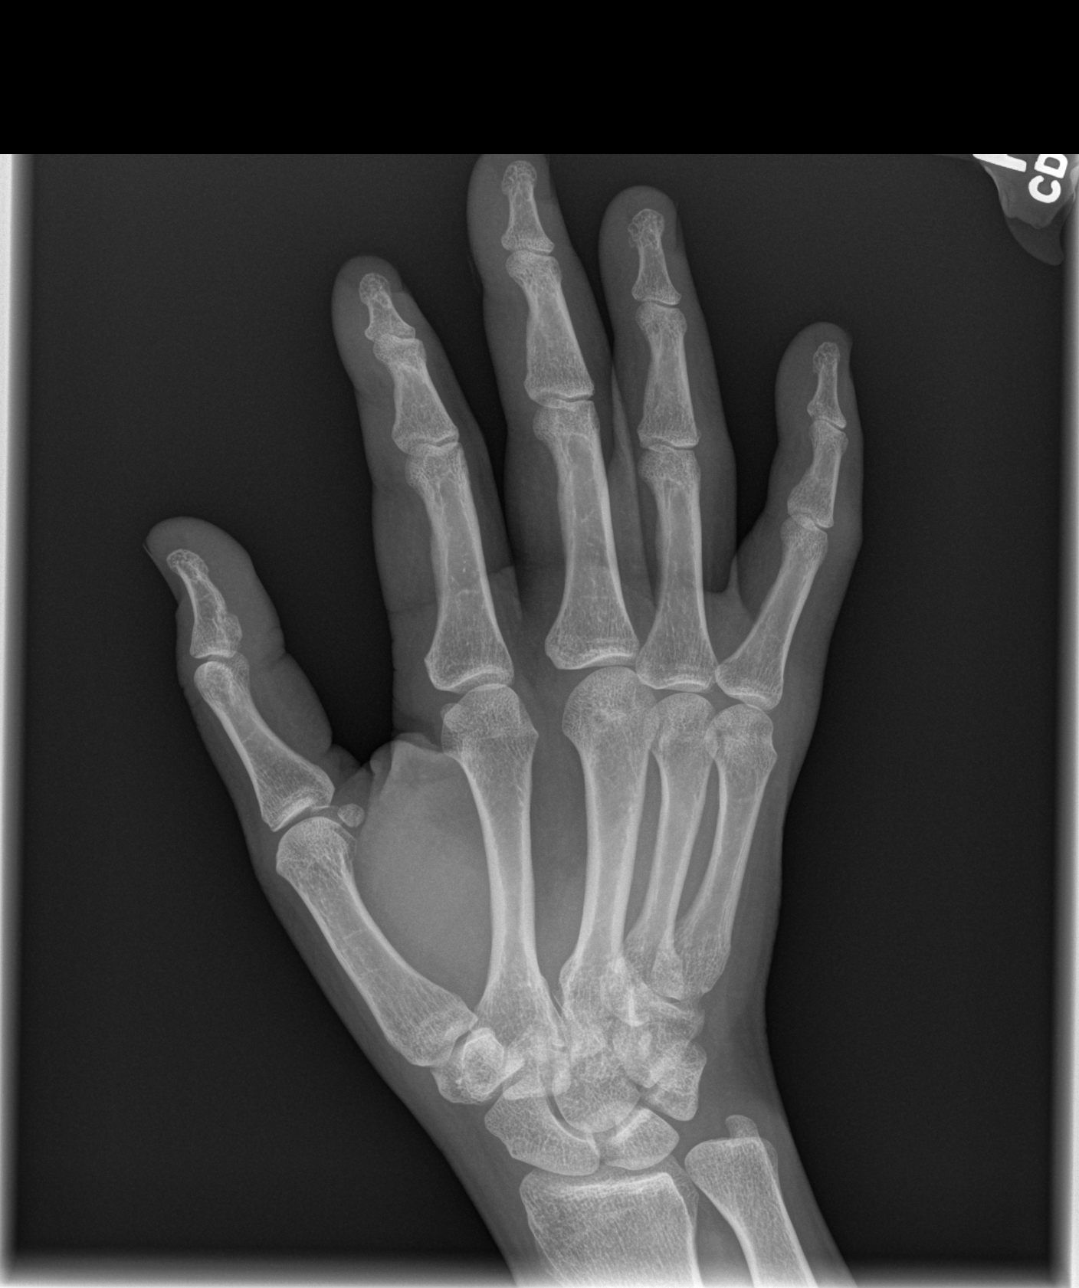

[x hand lat right]
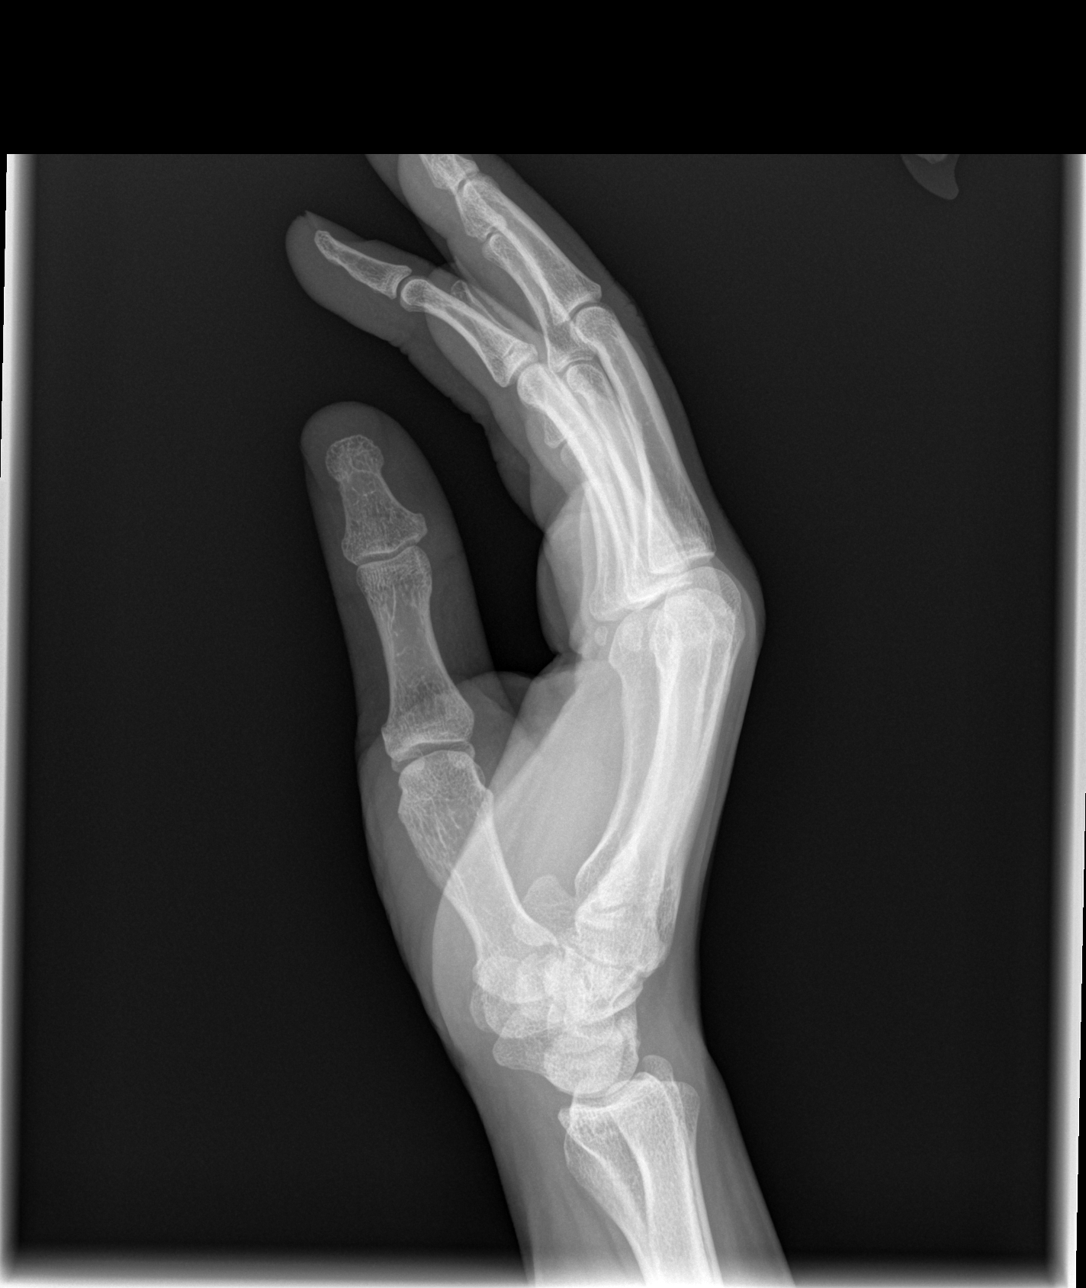

[3 of 3 positions shown; findings below may reference images not displayed]

FINDINGS: There is no evidence of fracture or dislocation. There is no
evidence of arthropathy or other focal bone abnormality. Soft
tissues are unremarkable.
IMPRESSION: Negative.

## 2024-08-30 ENCOUNTER — Encounter: Payer: Self-pay | Admitting: Oncology

## 2024-09-03 ENCOUNTER — Encounter: Payer: Self-pay | Admitting: Internal Medicine

## 2024-09-03 ENCOUNTER — Ambulatory Visit: Payer: Self-pay | Admitting: Internal Medicine

## 2024-09-03 VITALS — BP 122/70 | HR 56 | Temp 98.2°F | Resp 18 | Ht 66.0 in | Wt 180.1 lb

## 2024-09-03 DIAGNOSIS — R350 Frequency of micturition: Secondary | ICD-10-CM | POA: Insufficient documentation

## 2024-09-03 DIAGNOSIS — K5901 Slow transit constipation: Secondary | ICD-10-CM | POA: Diagnosis not present

## 2024-09-03 LAB — POCT URINALYSIS DIPSTICK
Bilirubin, UA: NEGATIVE
Glucose, UA: NEGATIVE
Ketones, UA: NEGATIVE
Leukocytes, UA: NEGATIVE
Nitrite, UA: NEGATIVE
Protein, UA: NEGATIVE
Spec Grav, UA: 1.025
Urobilinogen, UA: 0.2 U/dL
pH, UA: 6

## 2024-09-03 MED ORDER — POLYETHYLENE GLYCOL 3350 17 G PO PACK: 17.0000 g | PACK | Freq: Every day | ORAL | 0 refills | Status: AC

## 2024-09-03 MED ORDER — FESOTERODINE FUMARATE ER 8 MG PO TB24: 8.0000 mg | ORAL_TABLET | Freq: Every day | ORAL | 6 refills | Status: AC

## 2024-09-03 NOTE — Assessment & Plan Note (Signed)
"   She will try to drink more water.  She will use 1 tbsp in a glass of water and drink daily. "

## 2024-09-03 NOTE — Assessment & Plan Note (Signed)
"    I will send prescription Toviaz  8 mg daily. "

## 2024-09-03 NOTE — Progress Notes (Signed)
 "  New Patient Office Visit  Subjective    Patient ID: Brittany Potter, female    DOB: February 11, 1976  Age: 49 y.o. MRN: [id number]  CC:  Chief Complaint  Patient presents with   New Patient (Initial Visit)    HPI Brittany Potter presents to establish care with us .  Brittany Potter is complaining of constipation for long time.  Brittany Potter was given Colace and Brittany Potter has tried multiple over-the-counter medication but it does not help.  Brittany Potter admits that Brittany Potter does not drink a lot of water.    Brittany Potter is complaining of frequent urination at nighttime.  Brittany Potter says that Brittany Potter come back from bathroom and then Brittany Potter has to go back again.  No burning urination. No back pain.    Brittany Potter has tubal ligation.  Brittany Potter has a mammogram February 2024 and last colonoscopy was May 2024.  Brittany Potter had Pap smear done by gynecologist in 08/2023.   Outpatient Encounter Medications as of 09/03/2024  Medication Sig   docusate sodium  (COLACE) 100 MG capsule Take 1 capsule (100 mg total) by mouth 2 (two) times daily. (Patient not taking: Reported on 09/03/2024)   ibuprofen  (ADVIL ,MOTRIN ) 800 MG tablet Take 1 tablet (800 mg total) by mouth every 8 (eight) hours as needed. (Patient not taking: Reported on 09/03/2024)   oxyCODONE  (ROXICODONE ) 5 MG immediate release tablet Take 1 tablet (5 mg total) by mouth every 4 (four) hours as needed for severe pain. (Patient not taking: Reported on 09/03/2024)   Prenatal Vit-Fe Fumarate-FA (PRENATAL MULTIVITAMIN) TABS tablet Take 1 tablet by mouth daily at 12 noon. (Patient not taking: Reported on 09/03/2024)   traMADol  (ULTRAM ) 50 MG tablet Take 1 tablet (50 mg total) by mouth every 6 (six) hours as needed. (Patient not taking: Reported on 09/03/2024)   No facility-administered encounter medications on file as of 09/03/2024.    Past Medical History:  Diagnosis Date   AMA (advanced maternal age) multigravida 35+    Anemia    ITP (idiopathic thrombocytopenic purpura) 2008    Past Surgical History:  Procedure Laterality Date    LAPAROSCOPIC TUBAL LIGATION Bilateral 01/22/2016   Procedure: LAPAROSCOPIC TUBAL LIGATION;  Surgeon: Orvil MARLA Arenas, MD;  Location: [facility] ORS;  Service: Gynecology;  Laterality: Bilateral;   NO PAST SURGERIES      History reviewed. No pertinent family history.  Social History   Socioeconomic History   Marital status: Married    Spouse name: Not on file   Number of children: Not on file   Years of education: Not on file   Highest education level: Not on file  Occupational History   Not on file  Tobacco Use   Smoking status: Never   Smokeless tobacco: Never  Vaping Use   Vaping status: Never Used  Substance and Sexual Activity   Alcohol use: No   Drug use: No   Sexual activity: Yes  Other Topics Concern   Not on file  Social History Narrative   Not on file   Social Drivers of Health   Tobacco Use: Low Risk (09/03/2024)   Patient History    Smoking Tobacco Use: Never    Smokeless Tobacco Use: Never    Passive Exposure: Not on file  Financial Resource Strain: Not on file  Food Insecurity: Not on file  Transportation Needs: Not on file  Physical Activity: Not on file  Stress: Not on file  Social Connections: Not on file  Intimate Partner Violence: Not on file  Depression (PHQ2-9): Low Risk (09/03/2024)  Depression (PHQ2-9)    PHQ-2 Score: 0  Alcohol Screen: Not on file  Housing: Not on file  Utilities: Not on file  Health Literacy: Not on file    Review of Systems  Constitutional: Negative.   Respiratory: Negative.    Cardiovascular: Negative.   Gastrointestinal:  Positive for constipation.  Genitourinary:  Positive for frequency.        Objective    BP 122/70   Pulse (!) 56   Temp 98.2 F (36.8 C)   Resp 18   Ht 5' 6 (1.676 m)   Wt 180 lb 2 oz (81.7 kg)   SpO2 99%   BMI 29.07 kg/m   Physical Exam Constitutional:      Appearance: Normal appearance.  Abdominal:     General: Bowel sounds are normal.     Palpations: Abdomen is soft.   Neurological:     General: No focal deficit present.     Mental Status: Brittany Potter is alert and oriented to person, place, and time.         Assessment & Plan:   Problem List Items Addressed This Visit       Digestive   Slow transit constipation    Brittany Potter will try to drink more water.  Brittany Potter will use 1 tbsp in a glass of water and drink daily.        Other   Urinary frequency - Primary     I will send prescription Toviaz  8 mg daily.      Relevant Orders   POCT urinalysis dipstick (Completed)    Return in about 1 month (around 10/04/2024) for For PE.   Roetta Dare, MD   "

## 2024-10-01 ENCOUNTER — Encounter: Admitting: Internal Medicine
